# Patient Record
Sex: Male | Born: 1945 | Race: White | Hispanic: No
Health system: Southern US, Community
[De-identification: ages and names within clinical notes are randomized; demographics above are authoritative.]

## PROBLEM LIST (undated history)

## (undated) DIAGNOSIS — I4891 Unspecified atrial fibrillation: Secondary | ICD-10-CM

## (undated) DIAGNOSIS — I10 Essential (primary) hypertension: Secondary | ICD-10-CM

## (undated) DIAGNOSIS — E119 Type 2 diabetes mellitus without complications: Secondary | ICD-10-CM

## (undated) DIAGNOSIS — I639 Cerebral infarction, unspecified: Secondary | ICD-10-CM

---

## 2020-09-17 ENCOUNTER — Encounter (HOSPITAL_COMMUNITY): Payer: Self-pay

## 2020-09-17 ENCOUNTER — Observation Stay (HOSPITAL_COMMUNITY): Payer: Medicare Other

## 2020-09-17 ENCOUNTER — Emergency Department (HOSPITAL_COMMUNITY): Payer: Medicare Other

## 2020-09-17 ENCOUNTER — Observation Stay (HOSPITAL_BASED_OUTPATIENT_CLINIC_OR_DEPARTMENT_OTHER): Payer: Medicare Other

## 2020-09-17 ENCOUNTER — Other Ambulatory Visit: Payer: Self-pay

## 2020-09-17 ENCOUNTER — Inpatient Hospital Stay (HOSPITAL_COMMUNITY)
Admission: EM | Admit: 2020-09-17 | Discharge: 2020-09-19 | DRG: 065 | Disposition: A | Discharge status: Nursing Home (Includes ICF) | Payer: Medicare Other | Source: Skilled Nursing Facility | Attending: Internal Medicine | Admitting: Internal Medicine

## 2020-09-17 DIAGNOSIS — I63542 Cerebral infarction due to unspecified occlusion or stenosis of left cerebellar artery: Principal | ICD-10-CM | POA: Diagnosis present

## 2020-09-17 DIAGNOSIS — E785 Hyperlipidemia, unspecified: Secondary | ICD-10-CM | POA: Diagnosis present

## 2020-09-17 DIAGNOSIS — S0101XA Laceration without foreign body of scalp, initial encounter: Secondary | ICD-10-CM | POA: Diagnosis present

## 2020-09-17 DIAGNOSIS — F0390 Unspecified dementia without behavioral disturbance: Secondary | ICD-10-CM | POA: Diagnosis present

## 2020-09-17 DIAGNOSIS — I672 Cerebral atherosclerosis: Secondary | ICD-10-CM | POA: Diagnosis present

## 2020-09-17 DIAGNOSIS — R29701 NIHSS score 1: Secondary | ICD-10-CM | POA: Diagnosis present

## 2020-09-17 DIAGNOSIS — Z23 Encounter for immunization: Secondary | ICD-10-CM

## 2020-09-17 DIAGNOSIS — R29810 Facial weakness: Secondary | ICD-10-CM | POA: Diagnosis present

## 2020-09-17 DIAGNOSIS — I4510 Unspecified right bundle-branch block: Secondary | ICD-10-CM | POA: Diagnosis present

## 2020-09-17 DIAGNOSIS — E119 Type 2 diabetes mellitus without complications: Secondary | ICD-10-CM | POA: Diagnosis present

## 2020-09-17 DIAGNOSIS — Z8673 Personal history of transient ischemic attack (TIA), and cerebral infarction without residual deficits: Secondary | ICD-10-CM

## 2020-09-17 DIAGNOSIS — Z66 Do not resuscitate: Secondary | ICD-10-CM | POA: Diagnosis present

## 2020-09-17 DIAGNOSIS — I10 Essential (primary) hypertension: Secondary | ICD-10-CM | POA: Diagnosis present

## 2020-09-17 DIAGNOSIS — Z794 Long term (current) use of insulin: Secondary | ICD-10-CM

## 2020-09-17 DIAGNOSIS — Z20822 Contact with and (suspected) exposure to covid-19: Secondary | ICD-10-CM | POA: Diagnosis present

## 2020-09-17 DIAGNOSIS — D649 Anemia, unspecified: Secondary | ICD-10-CM | POA: Diagnosis present

## 2020-09-17 DIAGNOSIS — W06XXXA Fall from bed, initial encounter: Secondary | ICD-10-CM | POA: Diagnosis present

## 2020-09-17 DIAGNOSIS — I639 Cerebral infarction, unspecified: Secondary | ICD-10-CM

## 2020-09-17 DIAGNOSIS — W19XXXA Unspecified fall, initial encounter: Secondary | ICD-10-CM

## 2020-09-17 DIAGNOSIS — I6389 Other cerebral infarction: Secondary | ICD-10-CM

## 2020-09-17 DIAGNOSIS — N179 Acute kidney failure, unspecified: Secondary | ICD-10-CM | POA: Diagnosis present

## 2020-09-17 LAB — ECHOCARDIOGRAM COMPLETE
AR max vel: 2.87 cm2
AV Area VTI: 3.46 cm2
AV Area mean vel: 3.12 cm2
AV Mean grad: 3 mmHg
AV Peak grad: 7.1 mmHg
Ao pk vel: 1.33 m/s
Area-P 1/2: 2.76 cm2
Calc EF: 63.2 %
Height: 70 in
S' Lateral: 3.55 cm
Single Plane A2C EF: 53.4 %
Single Plane A4C EF: 71.6 %
Weight: 3440 oz

## 2020-09-17 LAB — URINALYSIS, ROUTINE W REFLEX MICROSCOPIC
Bilirubin Urine: NEGATIVE
Glucose, UA: NEGATIVE mg/dL
Ketones, ur: 20 mg/dL — AB
Nitrite: POSITIVE — AB
Protein, ur: 100 mg/dL — AB
Specific Gravity, Urine: 1.031 — ABNORMAL HIGH (ref 1.005–1.030)
pH: 5 (ref 5.0–8.0)

## 2020-09-17 LAB — RETICULOCYTES
Immature Retic Fract: 7.4 % (ref 2.3–15.9)
RBC.: 4.15 MIL/uL — ABNORMAL LOW (ref 4.22–5.81)
Retic Count, Absolute: 63.5 10*3/uL (ref 19.0–186.0)
Retic Ct Pct: 1.5 % (ref 0.4–3.1)

## 2020-09-17 LAB — LIPID PANEL
Cholesterol: 104 mg/dL (ref 0–200)
HDL: 28 mg/dL — ABNORMAL LOW (ref >40)
LDL Cholesterol: 59 mg/dL (ref 0–99)
Total CHOL/HDL Ratio: 3.7 RATIO
Triglycerides: 85 mg/dL (ref <150)
VLDL: 17 mg/dL (ref 0–40)

## 2020-09-17 LAB — IRON AND TIBC
Iron: 37 ug/dL — ABNORMAL LOW (ref 45–182)
Saturation Ratios: 20 % (ref 17.9–39.5)
TIBC: 185 ug/dL — ABNORMAL LOW (ref 250–450)
UIBC: 148 ug/dL

## 2020-09-17 LAB — COMPREHENSIVE METABOLIC PANEL
ALT: 15 U/L (ref 0–44)
AST: 27 U/L (ref 15–41)
Albumin: 2.8 g/dL — ABNORMAL LOW (ref 3.5–5.0)
Alkaline Phosphatase: 83 U/L (ref 38–126)
Anion gap: 12 (ref 5–15)
BUN: 13 mg/dL (ref 8–23)
CO2: 23 mmol/L (ref 22–32)
Calcium: 8.7 mg/dL — ABNORMAL LOW (ref 8.9–10.3)
Chloride: 103 mmol/L (ref 98–111)
Creatinine, Ser: 1.3 mg/dL — ABNORMAL HIGH (ref 0.61–1.24)
GFR, Estimated: 57 mL/min — ABNORMAL LOW (ref >60)
Glucose, Bld: 90 mg/dL (ref 70–99)
Potassium: 4.5 mmol/L (ref 3.5–5.1)
Sodium: 138 mmol/L (ref 135–145)
Total Bilirubin: 1.8 mg/dL — ABNORMAL HIGH (ref 0.3–1.2)
Total Protein: 5.9 g/dL — ABNORMAL LOW (ref 6.5–8.1)

## 2020-09-17 LAB — CBC WITH DIFFERENTIAL/PLATELET
Abs Immature Granulocytes: 0.02 10*3/uL (ref 0.00–0.07)
Basophils Absolute: 0 10*3/uL (ref 0.0–0.1)
Basophils Relative: 0 %
Eosinophils Absolute: 0 10*3/uL (ref 0.0–0.5)
Eosinophils Relative: 0 %
HCT: 40 % (ref 39.0–52.0)
Hemoglobin: 12.6 g/dL — ABNORMAL LOW (ref 13.0–17.0)
Immature Granulocytes: 0 %
Lymphocytes Relative: 10 %
Lymphs Abs: 0.7 10*3/uL (ref 0.7–4.0)
MCH: 28.3 pg (ref 26.0–34.0)
MCHC: 31.5 g/dL (ref 30.0–36.0)
MCV: 89.9 fL (ref 80.0–100.0)
Monocytes Absolute: 0.9 10*3/uL (ref 0.1–1.0)
Monocytes Relative: 13 %
Neutro Abs: 5.3 10*3/uL (ref 1.7–7.7)
Neutrophils Relative %: 77 %
Platelets: 270 10*3/uL (ref 150–400)
RBC: 4.45 MIL/uL (ref 4.22–5.81)
RDW: 13.8 % (ref 11.5–15.5)
WBC: 6.9 10*3/uL (ref 4.0–10.5)
nRBC: 0 % (ref 0.0–0.2)

## 2020-09-17 LAB — HEMOGLOBIN A1C
Hgb A1c MFr Bld: 8 % — ABNORMAL HIGH (ref 4.8–5.6)
Mean Plasma Glucose: 182.9 mg/dL

## 2020-09-17 LAB — CBC
HCT: 36.7 % — ABNORMAL LOW (ref 39.0–52.0)
Hemoglobin: 11.9 g/dL — ABNORMAL LOW (ref 13.0–17.0)
MCH: 28.7 pg (ref 26.0–34.0)
MCHC: 32.4 g/dL (ref 30.0–36.0)
MCV: 88.4 fL (ref 80.0–100.0)
Platelets: 238 10*3/uL (ref 150–400)
RBC: 4.15 MIL/uL — ABNORMAL LOW (ref 4.22–5.81)
RDW: 13.6 % (ref 11.5–15.5)
WBC: 6.6 10*3/uL (ref 4.0–10.5)
nRBC: 0 % (ref 0.0–0.2)

## 2020-09-17 LAB — RESP PANEL BY RT-PCR (FLU A&B, COVID) ARPGX2
Influenza A by PCR: NEGATIVE
Influenza B by PCR: NEGATIVE
SARS Coronavirus 2 by RT PCR: NEGATIVE

## 2020-09-17 LAB — FOLATE: Folate: 10 ng/mL (ref >5.9)

## 2020-09-17 LAB — CREATININE, SERUM
Creatinine, Ser: 1.27 mg/dL — ABNORMAL HIGH (ref 0.61–1.24)
GFR, Estimated: 58 mL/min — ABNORMAL LOW (ref >60)

## 2020-09-17 LAB — VITAMIN B12: Vitamin B-12: 212 pg/mL (ref 180–914)

## 2020-09-17 LAB — FERRITIN: Ferritin: 99 ng/mL (ref 24–336)

## 2020-09-17 MED ORDER — IOHEXOL 350 MG/ML SOLN
75.0000 mL | Freq: Once | INTRAVENOUS | Status: AC | PRN
  Administered 2020-09-17: 75 mL via INTRAVENOUS

## 2020-09-17 MED ORDER — ASPIRIN 300 MG RE SUPP: 300.0000 mg | Freq: Every day | RECTAL | Status: DC

## 2020-09-17 MED ORDER — HYDRALAZINE HCL 20 MG/ML IJ SOLN: 10.0000 mg | INTRAMUSCULAR | Status: DC | PRN

## 2020-09-17 MED ORDER — SODIUM CHLORIDE 0.9 % IV SOLN: INTRAVENOUS | Status: AC

## 2020-09-17 MED ORDER — LORAZEPAM 2 MG/ML IJ SOLN
1.0000 mg | Freq: Once | INTRAMUSCULAR | Status: AC
  Administered 2020-09-17: 1 mg via INTRAVENOUS
  Filled 2020-09-17: qty 1

## 2020-09-17 MED ORDER — ENOXAPARIN SODIUM 40 MG/0.4ML IJ SOSY
40.0000 mg | PREFILLED_SYRINGE | Freq: Every day | INTRAMUSCULAR | Status: DC
  Administered 2020-09-17 – 2020-09-18 (×2): 40 mg via SUBCUTANEOUS
  Filled 2020-09-17 (×2): qty 0.4

## 2020-09-17 MED ORDER — STROKE: EARLY STAGES OF RECOVERY BOOK
Freq: Once | Status: AC
  Filled 2020-09-17: qty 1

## 2020-09-17 MED ORDER — ASPIRIN 325 MG PO TABS
325.0000 mg | ORAL_TABLET | Freq: Every day | ORAL | Status: DC
  Administered 2020-09-18: 325 mg via ORAL
  Filled 2020-09-17 (×2): qty 1

## 2020-09-17 MED ORDER — TETANUS-DIPHTH-ACELL PERTUSSIS 5-2.5-18.5 LF-MCG/0.5 IM SUSY: 0.5000 mL | PREFILLED_SYRINGE | Freq: Once | INTRAMUSCULAR | Status: DC

## 2020-09-17 MED ORDER — ACETAMINOPHEN 325 MG PO TABS: 650.0000 mg | ORAL_TABLET | ORAL | Status: DC | PRN

## 2020-09-17 MED ORDER — ACETAMINOPHEN 650 MG RE SUPP: 650.0000 mg | RECTAL | Status: DC | PRN

## 2020-09-17 MED ORDER — MELATONIN 3 MG PO TABS
3.0000 mg | ORAL_TABLET | Freq: Every evening | ORAL | Status: DC | PRN
  Administered 2020-09-17: 3 mg via ORAL
  Filled 2020-09-17: qty 1

## 2020-09-17 MED ORDER — ACETAMINOPHEN 160 MG/5ML PO SOLN: 650.0000 mg | ORAL | Status: DC | PRN

## 2020-09-17 NOTE — ED Notes (Signed)
Attempted IV stick x 2  

## 2020-09-17 NOTE — TOC Initial Note (Signed)
Transition of Care [facility]) - Initial/Assessment Note    Patient Details  Name: Randall [name] MRN: [id number] Date of Birth: 1945-06-17  [facility] of [facility]) CM/SW Contact:    Baldemar Lenis, LCSW Phone Number: 09/17/2020, 4:26 PM  Clinical Narrative:          CSW contacted [facility] to discuss patient and evaluate if he can return with increased assist. CSW spoke with Lurena Joiner, who indicated that patient had only moved in about two hours prior to coming to the ED. He was admitted from [facility], where he was discharged 8/29. Lurena Joiner provided CSW with patient's Medicare information as well as contact information for patient's daughter, Jt Brabec [phone]). Per Lurena Joiner, she was unsure if the patient could return as he had just moved in, recommended speaking to the RN. CSW received a call from [place], [place] to discuss patient's current care needs. CSW read off PT and OT notes, and [facility] can increase care to accommodate +2 for the patient, but would need to discuss with the daughter. CSW attempted to reach patient's daughter Mardella Layman to discuss, left a voicemail. CSW updated MD and RN about records received from [facility], including home meds that can be put into the patient's chart. CSW to follow.         Expected Discharge Plan: Assisted Living Barriers to Discharge: Continued Medical Work up, English as a second language teacher   Patient Goals and CMS Choice Patient states their goals for this hospitalization and ongoing recovery are:: patient unable to participate in goal setting, only oriented to self CMS Medicare.gov Compare Post Acute Care list provided to:: Patient Represenative (must comment) Choice offered to / list presented to : Adult Children  Expected Discharge Plan and Services Expected Discharge Plan: Assisted Living       Living arrangements for the past 2 months: Post-Acute Facility                                      Prior  Living Arrangements/Services Living arrangements for the past 2 months: Post-Acute Facility Lives with:: Facility Resident Patient language and need for interpreter reviewed:: No Do you feel safe going back to the place where you live?: Yes      Need for Family Participation in Patient Care: Yes (Comment) Care giver support system in place?: Yes (comment)   Criminal Activity/Legal Involvement Pertinent to Current Situation/Hospitalization: No - Comment as needed  Activities of Daily Living      Permission Sought/Granted Permission sought to share information with : Family Supports, Oceanographer granted to share information with : Yes, Verbal Permission Granted  Share Information with NAME: Mardella Layman  Permission granted to share info w AGENCY: [employer]  Permission granted to share info w Relationship: Daughter     Emotional Assessment   Attitude/Demeanor/Rapport: Unable to Assess Affect (typically observed): Unable to Assess Orientation: : Oriented to Self Alcohol / Substance Use: Not Applicable Psych Involvement: No (comment)  Admission diagnosis:  Fall, initial encounter [W19.XXXA] Acute CVA (cerebrovascular accident) [facility]) [I63.9] Cerebrovascular accident (CVA), unspecified mechanism (HCC) [I63.9] Patient Active Problem List   Diagnosis Date Noted   Acute CVA (cerebrovascular accident) (HCC) 09/17/2020   ARF (acute renal failure) (HCC) 09/17/2020   Normocytic anemia 09/17/2020   PCP:  Pcp, No Pharmacy:  No Pharmacies Listed    Social Determinants of Health (SDOH) Interventions    Readmission Risk Interventions No  flowsheet data found.

## 2020-09-17 NOTE — ED Notes (Signed)
Pt refused MRI.

## 2020-09-17 NOTE — ED Notes (Signed)
Pt to CT

## 2020-09-17 NOTE — Progress Notes (Signed)
Pt's son at the bedside. Updated. MD notified and spoke to him. Pharm Tech called/papers were given which were sent from prior facility. Son is asking about DNR, will bring POA papers

## 2020-09-17 NOTE — Progress Notes (Addendum)
PROGRESS NOTE  Randall [name] NWG:[id number] DOB: 1945-03-23 DOA: 09/17/2020 PCP: Pcp, No   LOS: 0 days   Brief narrative:  Randall [name] is a 75 y.o. male presented to the hospital with  falling and hitting his face on the floor.  Patient was a poor historian did not  remember the event but did not lose consciousness.  In the ER, patient has a laceration on his face from the fall.  CT head and C-spine done showed subacute stroke on the left cerebellar area and old stroke in the left occipital area.  EKG showed normal sinus rhythm with RBBB.  Labs show elevated creatinine of 1.3 albumin 2.8 total bilirubin 1.8.  COVID test was negative.  No old labs to compare.  Patient was seen by neurology and was admitted for further work-up of stroke.  Assessment/Plan:  Principal Problem:   Acute CVA (cerebrovascular accident) (HCC) Active Problems:   ARF (acute renal failure) (HCC)   Normocytic anemia  Acute stroke.   Neurology on board.  Spoke with Dr. Pearlean Brownie.  CT head scan showed age-indeterminate left cerebellar infarct.  CT cervical spine showed  indeterminate left cerebellar infarct.  Check CTA of the head and neck.  MRI of the brain has been ordered.  Hemoglobin A1c of 8.0.  Status post fall with laceration in the face. Continue supportive care.  Check PT  Acute renal failure  No old labs to compare.  Creatinine of 1.3.  Anemia normocytic normochromic  Ferritin 99, iron at 37 and TIBC low at 185.  Vitamin B12 and folic acid acid within normal limits.  Follow hemoglobin.  Elevated total bilirubin levels.   AST ALT and alkaline phosphorus normal.  Follow LFTs.  DVT prophylaxis: enoxaparin (LOVENOX) injection 40 mg Start: 09/17/20 1000  Code Status: Full code  Family Communication:  I was finally able to talk to the family and updated the son on the phone. Answered all queries. The son is the HPOA.  Status is: Observation  The patient will require care spanning > 2 midnights and should be  moved to inpatient because: IV treatments appropriate due to intensity of illness or inability to take PO and Inpatient level of care appropriate due to severity of illness  Dispo: The patient is from: Home              Anticipated d/c is to: Home              Patient currently is not medically stable to d/c.   Difficult to place patient No  Consultants: Neurology  Procedures: None  Anti-infectives:  None  Anti-infectives (From admission, onward)    None      Subjective:  Today, patient was seen and examined at bedside.  Has a mild discomfort in the head.  Denies any headache, nausea vomiting.  Objective: Vitals:   09/17/20 0815 09/17/20 1047  BP:  (!) 174/80  Pulse:  70  Resp:  18  Temp: 98.6 F (37 C) 98.2 F (36.8 C)  SpO2:  98%    Intake/Output Summary (Last 24 hours) at 09/17/2020 1145 Last data filed at 09/17/2020 0900 Gross per 24 hour  Intake 165 ml  Output 0 ml  Net 165 ml   Filed Weights   09/17/20 0051  Weight: 97.5 kg   Body mass index is 30.85 kg/m.   Physical Exam:  GENERAL: Patient is alert awake and oriented. Not in obvious distress.  Obese. HENT: No scleral pallor or icterus. Pupils equally reactive  to light. Oral mucosa is moist.  Small laceration on the facial area  NECK: is supple, no gross swelling noted. CHEST: Clear to auscultation. No crackles or wheezes.  Diminished breath sounds bilaterally. CVS: S1 and S2 heard, no murmur. Regular rate and rhythm.  ABDOMEN: Soft, non-tender, bowel sounds are present. EXTREMITIES: No edema. CNS: Cranial nerves are intact. No focal motor deficits.  Oriented to name and place. SKIN: warm and dry without rashes.  Data Review: I have personally reviewed the following laboratory data and studies,  CBC: Recent Labs  Lab 09/17/20 0300 09/17/20 0645  WBC 6.9 6.6  NEUTROABS 5.3  --   HGB 12.6* 11.9*  HCT 40.0 36.7*  MCV 89.9 88.4  PLT 270 238   Basic Metabolic Panel: Recent Labs  Lab  09/17/20 0300 09/17/20 0645  NA 138  --   K 4.5  --   CL 103  --   CO2 23  --   GLUCOSE 90  --   BUN 13  --   CREATININE 1.30* 1.27*  CALCIUM 8.7*  --    Liver Function Tests: Recent Labs  Lab 09/17/20 0300  AST 27  ALT 15  ALKPHOS 83  BILITOT 1.8*  PROT 5.9*  ALBUMIN 2.8*   No results for input(s): LIPASE, AMYLASE in the last 168 hours. No results for input(s): AMMONIA in the last 168 hours. Cardiac Enzymes: No results for input(s): CKTOTAL, CKMB, CKMBINDEX, TROPONINI in the last 168 hours. BNP (last 3 results) No results for input(s): BNP in the last 8760 hours.  ProBNP (last 3 results) No results for input(s): PROBNP in the last 8760 hours.  CBG: No results for input(s): GLUCAP in the last 168 hours. Recent Results (from the past 240 hour(s))  Resp Panel by RT-PCR (Flu A&B, Covid) Nasopharyngeal Swab     Status: None   Collection Time: 09/17/20  3:00 AM   Specimen: Nasopharyngeal Swab; Nasopharyngeal(NP) swabs in vial transport medium  Result Value Ref Range Status   SARS Coronavirus 2 by RT PCR NEGATIVE NEGATIVE Final    Comment: (NOTE) SARS-CoV-2 target nucleic acids are NOT DETECTED.  The SARS-CoV-2 RNA is generally detectable in upper respiratory specimens during the acute phase of infection. The lowest concentration of SARS-CoV-2 viral copies this assay can detect is 138 copies/mL. A negative result does not preclude SARS-Cov-2 infection and should not be used as the sole basis for treatment or other patient management decisions. A negative result may occur with  improper specimen collection/handling, submission of specimen other than nasopharyngeal swab, presence of viral mutation(s) within the areas targeted by this assay, and inadequate number of viral copies(<138 copies/mL). A negative result must be combined with clinical observations, patient history, and epidemiological information. The expected result is Negative.  Fact Sheet for Patients:   BloggerCourse.com  Fact Sheet for Healthcare Providers:  SeriousBroker.it  This test is no t yet approved or cleared by the [place] FDA and  has been authorized for detection and/or diagnosis of SARS-CoV-2 by FDA under an Emergency Use Authorization (EUA). This EUA will remain  in effect (meaning this test can be used) for the duration of the COVID-19 declaration under Section 564(b)(1) of the Act, 21 U.S.C.section 360bbb-3(b)(1), unless the authorization is terminated  or revoked sooner.       Influenza A by PCR NEGATIVE NEGATIVE Final   Influenza B by PCR NEGATIVE NEGATIVE Final    Comment: (NOTE) The Xpert Xpress SARS-CoV-2/FLU/RSV plus assay is intended as an aid  in the diagnosis of influenza from Nasopharyngeal swab specimens and should not be used as a sole basis for treatment. Nasal washings and aspirates are unacceptable for Xpert Xpress SARS-CoV-2/FLU/RSV testing.  Fact Sheet for Patients: BloggerCourse.comhttps://www.fda.gov/media/152166/download  Fact Sheet for Healthcare Providers: SeriousBroker.ithttps://www.fda.gov/media/152162/download  This test is not yet approved or cleared by the [place] FDA and has been authorized for detection and/or diagnosis of SARS-CoV-2 by FDA under an Emergency Use Authorization (EUA). This EUA will remain in effect (meaning this test can be used) for the duration of the COVID-19 declaration under Section 564(b)(1) of the Act, 21 U.S.C. section 360bbb-3(b)(1), unless the authorization is terminated or revoked.  Performed at [facility], [address] [place], [place] [address]      Studies: CT HEAD WO CONTRAST (5MM)  Result Date: 09/17/2020 CLINICAL DATA:  Trauma. EXAM: CT HEAD WITHOUT CONTRAST CT CERVICAL SPINE WITHOUT CONTRAST TECHNIQUE: Multidetector CT imaging of the head and cervical spine was performed following the standard protocol without intravenous contrast. Multiplanar CT image  reconstructions of the cervical spine were also generated. COMPARISON:  None. FINDINGS: CT HEAD FINDINGS Brain: Moderate age-related atrophy and chronic microvascular ischemic changes. 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 An area of infarct involving the left cerebellum, age indeterminate, possibly subacute. Acute infarct is not excluded clinical correlation is recommended. MRI may provide better evaluation if there is high clinical concern for acute infarct. There is no acute intracranial hemorrhage. No mass effect or midline shift. No extra-axial fluid collection. Vascular: No hyperdense vessel or unexpected calcification. Skull: Normal. Negative for fracture or focal lesion. Sinuses/Orbits: No acute finding. Other: None CT CERVICAL SPINE FINDINGS Alignment: No acute subluxation. Skull base and vertebrae: Acute fracture.  Osteopenia. Soft tissues and spinal canal: No prevertebral fluid or swelling. No visible canal hematoma. Disc levels:  Degenerative changes primarily at C5-C6. Upper chest: Negative. Other: Bilateral carotid bulb calcified plaques. IMPRESSION: 1. No acute intracranial hemorrhage. 2. Age indeterminate left cerebellar infarct, possibly subacute. Clinical correlation is recommended. Old left occipital infarct. 3. No acute/traumatic cervical spine pathology. These results were called by telephone at the time of interpretation on 09/17/2020 at 1:58 am to provider [facility] , who verbally acknowledged these results. Electronically Signed   By: Elgie CollardArash  Radparvar M.D.   On: 09/17/2020 02:09   CT Cervical Spine Wo Contrast  Result Date: 09/17/2020 CLINICAL DATA:  Trauma. EXAM: CT HEAD WITHOUT CONTRAST CT CERVICAL SPINE WITHOUT CONTRAST TECHNIQUE: Multidetector CT imaging of the head and cervical spine was performed following the standard protocol without intravenous contrast. Multiplanar CT image reconstructions of the cervical spine were also generated. COMPARISON:  None. FINDINGS: CT  HEAD FINDINGS Brain: Moderate age-related atrophy and chronic microvascular ischemic changes. 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 An area of infarct involving the left cerebellum, age indeterminate, possibly subacute. Acute infarct is not excluded clinical correlation is recommended. MRI may provide better evaluation if there is high clinical concern for acute infarct. There is no acute intracranial hemorrhage. No mass effect or midline shift. No extra-axial fluid collection. Vascular: No hyperdense vessel or unexpected calcification. Skull: Normal. Negative for fracture or focal lesion. Sinuses/Orbits: No acute finding. Other: None CT CERVICAL SPINE FINDINGS Alignment: No acute subluxation. Skull base and vertebrae: Acute fracture.  Osteopenia. Soft tissues and spinal canal: No prevertebral fluid or swelling. No visible canal hematoma. Disc levels:  Degenerative changes primarily at C5-C6. Upper chest: Negative. Other: Bilateral carotid bulb calcified plaques. IMPRESSION: 1. No acute intracranial hemorrhage. 2. Age  indeterminate left cerebellar infarct, possibly subacute. Clinical correlation is recommended. Old left occipital infarct. 3. No acute/traumatic cervical spine pathology. These results were called by telephone at the time of interpretation on 09/17/2020 at 1:58 am to provider [facility] , who verbally acknowledged these results. Electronically Signed   By: Elgie Collard M.D.   On: 09/17/2020 02:09   DG Pelvis Portable  Result Date: 09/17/2020 CLINICAL DATA:  Trauma. EXAM: PORTABLE PELVIS 1-2 VIEWS COMPARISON:  None. FINDINGS: There is no acute fracture or dislocation the bones are osteopenic. Degenerative changes of the lower lumbar spine. The soft tissues are unremarkable. IMPRESSION: No acute findings. Electronically Signed   By: Elgie Collard M.D.   On: 09/17/2020 01:21   DG Chest Portable 1 View  Result Date: 09/17/2020 CLINICAL DATA:  Trauma. EXAM: PORTABLE CHEST 1 VIEW  COMPARISON:  None. FINDINGS: Mild cardiomegaly. No focal consolidation or pneumothorax. Trace right pleural effusion present. Atherosclerotic calcification of the aorta. No acute osseous pathology. IMPRESSION: 1. Mild cardiomegaly. 2. Possible trace right pleural effusion.  No consolidation. Electronically Signed   By: Elgie Collard M.D.   On: 09/17/2020 01:20      Joycelyn Das, MD  [facility] 09/17/2020  If 7PM-7AM, please contact night-coverage

## 2020-09-17 NOTE — Evaluation (Signed)
Occupational Therapy Evaluation Patient Details Name: Randall [name] MRN: [id number] DOB: 17-Jun-1945 Today's Date: 09/17/2020    History of Present Illness Pt is a 75yo male from [facility] fell out of bed and hit head on recliner. 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 Labs reveal acute renal failure and anemia. PMH: unknown at this time.   Clinical Impression   PT admitted with s/p fall with acute CVA. Pt currently with functional limitiations due to the deficits listed below (see OT problem list). Pt currently with balance deficits and requires +2 max (A) with transfer using RW. Pt (A) for static standing. Pt with cognitive deficits and needs redirection to task.  Pt will benefit from skilled OT to increase their independence and safety with adls and balance to allow discharge SNF ( return to current facility if can manage at this level).     Follow Up Recommendations  SNF    Equipment Recommendations  None recommended by OT    Recommendations for Other Services       Precautions / Restrictions Precautions Precautions: Fall Restrictions Weight Bearing Restrictions: No      Mobility Bed Mobility Overal bed mobility: Needs Assistance Bed Mobility: Supine to Sit;Sit to Supine     Supine to sit: Mod assist;+2 for physical assistance;+2 for safety/equipment;HOB elevated Sit to supine: Mod assist;+2 for physical assistance   General bed mobility comments: Pt required mod assist +2 for physical assist for sit to supine, including LE advancement off bed and trunk control to acheive upright sitting posture. Min assist to stay sitting upright. Max multimodal cuing and encouragement to complete task. pt requesting the bed to (A)    Transfers Overall transfer level: Needs assistance Equipment used: Rolling walker (2 wheeled) Transfers: Sit to/from Stand Sit to Stand: Min assist;+2  safety/equipment         General transfer comment: Pt required min assist +2 for lift assist and steadying as well as managing lines. For stand to sit transfer pt min assist for safe lowering to bed as pt was expressing fatigue and the desire to lay down. Pt was able to stand for ~15s to allow for removal of briefs prior to sitting.    Balance Overall balance assessment: Needs assistance Sitting-balance support: Single extremity supported;Feet supported Sitting balance-Leahy Scale: Poor Sitting balance - Comments: Pt able to sit with single extremity support and min assist from the posterior.   Standing balance support: Bilateral upper extremity supported;During functional activity Standing balance-Leahy Scale: Poor Standing balance comment: Pt reliant on BUE on RW during ambulation in room. Able to stand for a brief period to wash face but demonstrated increasing anterior lean and required cuing and physical assist to correct.                           ADL either performed or assessed with clinical judgement   ADL Overall ADL's : Needs assistance/impaired     Grooming: Wash/dry hands;Wash/dry face;Standing;Minimal assistance (total +2 Mod (A) to static stand)               Lower Body Dressing: Total assistance;Bed level         Toileting - Clothing Manipulation Details (indicate cue type and reason): declines to void and noted to have saturated brief without awareness. pt with brief sliding off due to soiled heavily. RN made aware pt without  brief and incontinence     Functional mobility during ADLs: +2 for physical assistance;Maximal assistance;Rolling walker General ADL Comments: pt reports at baseline getting help for adls. pt total (A) for socks this session. pt asking staff to have bed complete bed transfer. pt expressed "they do it" when asked about bathing/ dressing     Vision   Additional Comments: pt reports baseline R hemianopsia from previou CVA. pt  keeping eyes closed majority of session. not agreable to formal testing. light sensitive and noted to have swelling above L eye     Perception     Praxis      Pertinent Vitals/Pain Pain Assessment: No/denies pain     Hand Dominance Right   Extremity/Trunk Assessment Upper Extremity Assessment Upper Extremity Assessment: Generalized weakness;LUE deficits/detail LUE Deficits / Details: noted to be weaker than R but diffculty with formal testing due to patient expressing fatigue and engaging   Lower Extremity Assessment Lower Extremity Assessment: Generalized weakness;LLE deficits/detail;RLE deficits/detail RLE Sensation: WNL LLE Deficits / Details: Pt reports his left leg is weaker than right. LLE Sensation: WNL   Cervical / Trunk Assessment Cervical / Trunk Assessment: Normal   Communication Communication Communication: No difficulties   Cognition Arousal/Alertness: Awake/alert;Lethargic Behavior During Therapy: Flat affect Overall Cognitive Status: No family/caregiver present to determine baseline cognitive functioning                                 General Comments: Pt has history of CVA but denies any changes in cognition. Pt generally tired and expressed desire to sleep. pt asking the same questions several time during session with no recall within seconds of information provided. recommend 1 step commands. pt declined oob but agreeable to wash face so cued we are sitting up to wash your face. pt state okay at this point   General Comments  pt noted to have wound on R side of face. Pt aware of injury to mouth when asked questions    Exercises     Shoulder Instructions      Home Living Family/patient expects to be discharged to:: Assisted living                             Home Equipment: Walker - 2 wheels          Prior Functioning/Environment Level of Independence: Needs assistance  Gait / Transfers Assistance Needed: Uses RW for  short distance ambulation at assisted living ADL's / Homemaking Assistance Needed: Needs assist for all ADLs, mentioned bathing, dressing, cooking. Communication / Swallowing Assistance Needed: Delayed answers to home environment questions          OT Problem List: Decreased strength;Decreased activity tolerance;Impaired balance (sitting and/or standing);Decreased safety awareness;Decreased knowledge of use of DME or AE;Decreased knowledge of precautions;Obesity;Decreased cognition      OT Treatment/Interventions: Self-care/ADL training;Therapeutic exercise;Neuromuscular education;Energy conservation;DME and/or AE instruction;Manual therapy;Modalities;Therapeutic activities;Cognitive remediation/compensation;Visual/perceptual remediation/compensation;Patient/family education;Balance training    OT Goals(Current goals can be found in the care plan section) Acute Rehab OT Goals Patient Stated Goal: to sleep OT Goal Formulation: Patient unable to participate in goal setting Time For Goal Achievement: 10/01/20 Potential to Achieve Goals: Good  OT Frequency: Min 2X/week   Barriers to D/C:            Co-evaluation PT/OT/SLP Co-Evaluation/Treatment: Yes Reason for Co-Treatment: Complexity of the patient's impairments (multi-system involvement);Necessary to address cognition/behavior  during functional activity;For patient/therapist safety;To address functional/ADL transfers PT goals addressed during session: Mobility/safety with mobility OT goals addressed during session: ADL's and self-care;Proper use of Adaptive equipment and DME;Strengthening/ROM      AM-PAC OT "6 Clicks" Daily Activity     Outcome Measure Help from another person eating meals?: A Lot Help from another person taking care of personal grooming?: A Lot Help from another person toileting, which includes using toliet, bedpan, or urinal?: A Lot Help from another person bathing (including washing, rinsing, drying)?: A  Lot Help from another person to put on and taking off regular upper body clothing?: A Lot Help from another person to put on and taking off regular lower body clothing?: Total 6 Click Score: 11   End of Session Equipment Utilized During Treatment: Gait belt;Rolling walker Nurse Communication: Mobility status;Precautions  Activity Tolerance: Patient limited by fatigue Patient left: in bed;with call bell/phone within reach;with bed alarm set  OT Visit Diagnosis: Unsteadiness on feet (R26.81);Muscle weakness (generalized) (M62.81);Repeated falls (R29.6)                Time: 7017-7939 OT Time Calculation (min): 21 min Charges:  OT General Charges $OT Visit: 1 Visit OT Evaluation $OT Eval Moderate Complexity: 1 Mod   Brynn, OTR/L  Acute Rehabilitation Services Pager: [phone] Office: [phone] .   Mateo Flow 09/17/2020, 11:07 AM

## 2020-09-17 NOTE — Progress Notes (Addendum)
Physical Therapy Evaluation Patient Details Name: Randall [name] MRN: [id number] DOB: 1946-01-03 Today's Date: 09/17/2020   History of Present Illness  Pt is a 75yo male from [facility] fell out of bed and hit head on recliner. 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 Labs reveal acute renal failure and anemia. PMH: unknown at this time.  Clinical Impression  Pt presents with the impairments above and problems below. Required mod assist +2 for bed mobility, min assist +2 for transfers. Initially, pt requiring min assist +2 for ambulation with RW to sink. During ambulation back to bed, pt with increased unsteadiness and repeated LOBs to the R so level of assist increased to max assist +2. Treatment session limited by pt's fatigue. Recommending SNF-level therapies upon discharge unless ALF able to provide necessary assist. If ALF can provide necessary assist, will benefit from HHPT. We will continue to follow him acutely to promote independence with functional mobility.     Follow Up Recommendations SNF (unless ALF able to provide necessary assist.)    Equipment Recommendations  Wheelchair (measurements PT);Wheelchair cushion (measurements PT)    Recommendations for Other Services       Precautions / Restrictions Precautions Precautions: Fall Restrictions Weight Bearing Restrictions: No      Mobility  Bed Mobility Overal bed mobility: Needs Assistance Bed Mobility: Supine to Sit;Sit to Supine     Supine to sit: Mod assist;+2 for physical assistance;+2 for safety/equipment;HOB elevated Sit to supine: Mod assist;+2 for physical assistance   General bed mobility comments: Pt required mod assist +2 for physical assist for sit to supine, including LE advancement off bed and trunk control to acheive upright sitting posture. Min assist to stay sitting upright. Max multimodal cuing and encouragement  to complete task. pt requesting the bed to (A)    Transfers Overall transfer level: Needs assistance Equipment used: Rolling walker (2 wheeled) Transfers: Sit to/from Stand Sit to Stand: Min assist;+2 safety/equipment         General transfer comment: Pt required min assist +2 for lift assist and steadying as well as managing lines. For stand to sit transfer pt min assist for safe lowering to bed as pt was expressing fatigue and the desire to lay down. Pt was able to stand for ~15s to allow for removal of briefs prior to sitting.  Ambulation/Gait Ambulation/Gait assistance: Min assist;+2 safety/equipment;Max assist;+2 physical assistance Gait Distance (Feet): 8 Feet Assistive device: Rolling walker (2 wheeled) Gait Pattern/deviations: Step-to pattern;Decreased step length - right;Decreased step length - left;Decreased dorsiflexion - right;Decreased dorsiflexion - left;Shuffle;Staggering right Gait velocity: decreased   General Gait Details: Pt ambulated with RW from EOB to sink to perform cleanup of facial blood, min assist +2 for physical assist for steadying and safety (IV bag did not have pole). Pt able to stand at sink for ~90s and wipe off face. As length of standing progressed, pt began to demonstrate anterior lean and express fatigue. On return trip to EOB, pt required max assist +2 for steadying as pt with R lateral lean and multiple LOB. Pt also with difficulty using RW correctly and tended to carry it.  Stairs            Wheelchair Mobility    Modified Rankin (Stroke Patients Only) Modified Rankin (Stroke Patients Only) Pre-Morbid Rankin Score: Moderately severe disability Modified Rankin: Moderately severe disability     Balance Overall balance assessment: Needs assistance Sitting-balance  support: Single extremity supported;Feet supported Sitting balance-Leahy Scale: Poor Sitting balance - Comments: Pt able to sit with single extremity support and min assist from  the posterior.   Standing balance support: Bilateral upper extremity supported;During functional activity Standing balance-Leahy Scale: Poor Standing balance comment: Pt reliant on BUE on RW during ambulation in room. Able to stand for a brief period to wash face but demonstrated increasing anterior lean and required cuing and physical assist to correct.                             Pertinent Vitals/Pain Pain Assessment: No/denies pain    Home Living Family/patient expects to be discharged to:: Assisted living               Home Equipment: Walker - 2 wheels      Prior Function Level of Independence: Needs assistance   Gait / Transfers Assistance Needed: Uses RW for short distance ambulation at assisted living  ADL's / Homemaking Assistance Needed: Needs assist for all ADLs, mentioned bathing, dressing, cooking.        Hand Dominance   Dominant Hand: Right    Extremity/Trunk Assessment   Upper Extremity Assessment Upper Extremity Assessment: Generalized weakness;LUE deficits/detail LUE Deficits / Details: noted to be weaker than R but diffculty with formal testing due to patient expressing fatigue and engaging    Lower Extremity Assessment Lower Extremity Assessment: Generalized weakness;LLE deficits/detail;RLE deficits/detail RLE Sensation: WNL LLE Deficits / Details: Pt reports his left leg is weaker than right. LLE Sensation: WNL    Cervical / Trunk Assessment Cervical / Trunk Assessment: Normal  Communication   Communication: No difficulties  Cognition Arousal/Alertness: Awake/alert;Lethargic Behavior During Therapy: Flat affect Overall Cognitive Status: No family/caregiver present to determine baseline cognitive functioning                                 General Comments: Pt has history of CVA but denies any changes in cognition. Pt generally tired and expressed desire to sleep. pt asking the same questions several time during  session with no recall within seconds of information provided. recommend 1 step commands. pt declined oob but agreeable to wash face so cued we are sitting up to wash your face. pt state okay at this point      General Comments General comments (skin integrity, edema, etc.): pt noted to have wound on R side of face. Pt aware of injury to mouth when asked questions    Exercises     Assessment/Plan    PT Assessment Patient needs continued PT services  PT Problem List Decreased strength;Decreased range of motion;Decreased activity tolerance;Decreased balance;Decreased mobility;Decreased coordination;Decreased cognition;Decreased knowledge of use of DME;Decreased safety awareness       PT Treatment Interventions DME instruction;Gait training;Stair training;Functional mobility training;Therapeutic activities;Therapeutic exercise;Balance training;Neuromuscular re-education;Cognitive remediation;Wheelchair mobility training    PT Goals (Current goals can be found in the Care Plan section)  Acute Rehab PT Goals Patient Stated Goal: to sleep PT Goal Formulation: With patient Time For Goal Achievement: 10/01/20 Potential to Achieve Goals: Good    Frequency Min 3X/week   Barriers to discharge        Co-evaluation PT/OT/SLP Co-Evaluation/Treatment: Yes Reason for Co-Treatment: Complexity of the patient's impairments (multi-system involvement);Necessary to address cognition/behavior during functional activity;For patient/therapist safety;To address functional/ADL transfers PT goals addressed during session: Mobility/safety with mobility OT goals addressed  during session: ADL's and self-care;Proper use of Adaptive equipment and DME;Strengthening/ROM       AM-PAC PT "6 Clicks" Mobility  Outcome Measure Help needed turning from your back to your side while in a flat bed without using bedrails?: A Little Help needed moving from lying on your back to sitting on the side of a flat bed without  using bedrails?: Total Help needed moving to and from a bed to a chair (including a wheelchair)?: Total Help needed standing up from a chair using your arms (e.g., wheelchair or bedside chair)?: Total Help needed to walk in hospital room?: Total Help needed climbing 3-5 steps with a railing? : Total 6 Click Score: 8    End of Session Equipment Utilized During Treatment: Gait belt Activity Tolerance: Patient limited by fatigue Patient left: in bed;with call bell/phone within reach;with bed alarm set Nurse Communication: Mobility status PT Visit Diagnosis: Other symptoms and signs involving the nervous system (R29.898);Muscle weakness (generalized) (M62.81);Other abnormalities of gait and mobility (R26.89);History of falling (Z91.81)    Time: 1005-1026 PT Time Calculation (min) (ACUTE ONLY): 21 min   Charges:   PT Evaluation $PT Eval Moderate Complexity: 1 Mod          Johnn Hai, SPT Johnn Hai 09/17/2020, 1:24 PM

## 2020-09-17 NOTE — H&P (Signed)
History and Physical    Randall [name] TGP:[id number] DOB: [birth year] DOA: 09/17/2020  PCP: Pcp, No  Patient coming from: Assisted living facility.  Chief Complaint: Fall.  HPI: Randall [name] is a 75 y.o. male with unknown past medical history was brought to the ER after patient slid out of the bed and he fell onto the floor hitting his face.  Patient is a poor historian and does not remember or recall his past medical history.  Patient is stating that he fell from the bed but did not lose consciousness.  Denies any pain.  Is able to move all extremities.  ED Course: In the ER patient has a laceration on his face from the fall.  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  EKG shows normal sinus rhythm with RBBB.  Labs show elevated creatinine of 1.3 albumin 2.8 total bilirubin 1.8 globin 12.6 COVID test pending.  No old labs to compare.  Patient was seen by neurologist Dr. Gwyneth Sprout and patient has been admitted for further work-up of stroke.  Review of Systems: As per HPI, rest all negative.   History reviewed. No pertinent past medical history.  History reviewed. No pertinent surgical history.   reports that he has never smoked. He has never used smokeless tobacco. He reports that he does not drink alcohol. No history on file for drug use.  No Known Allergies  Family History  Family history unknown: Yes    Prior to Admission medications   Not on File    Physical Exam: Constitutional: Moderately built and nourished. Vitals:   09/17/20 0330 09/17/20 0345 09/17/20 0415 09/17/20 0430  BP: (!) 130/46 (!) 160/66 (!) 168/71 140/65  Pulse: 61 84 74 74  Resp: _0 Temp:      SpO2: 99% 99% 98% 96%  Weight:      Height:       Eyes: Anicteric no pallor. ENMT: No discharge from the ears eyes nose with a small laceration on the facial area with some bleeding. Neck: No mass felt.  No neck  rigidity. Respiratory: No rhonchi or crepitations. Cardiovascular: S1-S2 heard. Abdomen: Soft nontender bowel sounds present. Musculoskeletal: No edema. Skin: No rash. Neurologic: Alert awake oriented to person and place moving all extremities 5 x 5 no facial asymmetry tongue is midline pupils equal reacting to light. Psychiatric: Oriented to name and place.   Labs on Admission: I have personally reviewed following labs and imaging studies  CBC: Recent Labs  Lab 09/17/20 0300  WBC 6.9  NEUTROABS 5.3  HGB 12.6*  HCT 40.0  MCV 89.9  PLT 309   Basic Metabolic Panel: Recent Labs  Lab 09/17/20 0300  NA 138  K 4.5  CL 103  CO2 23  GLUCOSE 90  BUN 13  CREATININE 1.30*  CALCIUM 8.7*   GFR: Estimated Creatinine Clearance: 55.7 mL/min (A) (by C-G formula based on SCr of 1.3 mg/dL (H)). Liver Function Tests: Recent Labs  Lab 09/17/20 0300  AST 27  ALT 15  ALKPHOS 83  BILITOT 1.8*  PROT 5.9*  ALBUMIN 2.8*   No results for input(s): LIPASE, AMYLASE in the last 168 hours. No results for input(s): AMMONIA in the last 168 hours. Coagulation Profile: No results for input(s): INR, PROTIME in the last 168 hours. Cardiac Enzymes: No results for input(s): CKTOTAL, CKMB, CKMBINDEX, TROPONINI in the last 168 hours. BNP (last 3 results) No  results for input(s): PROBNP in the last 8760 hours. HbA1C: No results for input(s): HGBA1C in the last 72 hours. CBG: No results for input(s): GLUCAP in the last 168 hours. Lipid Profile: No results for input(s): CHOL, HDL, LDLCALC, TRIG, CHOLHDL, LDLDIRECT in the last 72 hours. Thyroid Function Tests: No results for input(s): TSH, T4TOTAL, FREET4, T3FREE, THYROIDAB in the last 72 hours. Anemia Panel: No results for input(s): VITAMINB12, FOLATE, FERRITIN, TIBC, IRON, RETICCTPCT in the last 72 hours. Urine analysis: No results found for: COLORURINE, APPEARANCEUR, LABSPEC, PHURINE, GLUCOSEU, HGBUR, BILIRUBINUR, KETONESUR, PROTEINUR,  UROBILINOGEN, NITRITE, LEUKOCYTESUR Sepsis Labs: _0 (procalcitonin:4,lacticidven:4) ) Recent Results (from the past 240 hour(s))  Resp Panel by RT-PCR (Flu A&B, Covid) Nasopharyngeal Swab     Status: None   Collection Time: 09/17/20  3:00 AM   Specimen: Nasopharyngeal Swab; Nasopharyngeal(NP) swabs in vial transport medium  Result Value Ref Range Status   SARS Coronavirus 2 by RT PCR NEGATIVE NEGATIVE Final    Comment: (NOTE) SARS-CoV-2 target nucleic acids are NOT DETECTED.  The SARS-CoV-2 RNA is generally detectable in upper respiratory specimens during the acute phase of infection. The lowest concentration of SARS-CoV-2 viral copies this assay can detect is 138 copies/mL. A negative result does not preclude SARS-Cov-2 infection and should not be used as the sole basis for treatment or other patient management decisions. A negative result may occur with  improper specimen collection/handling, submission of specimen other than nasopharyngeal swab, presence of viral mutation(s) within the areas targeted by this assay, and inadequate number of viral copies(<138 copies/mL). A negative result must be combined with clinical observations, patient history, and epidemiological information. The expected result is Negative.  Fact Sheet for Patients:  EntrepreneurPulse.com.au  Fact Sheet for Healthcare Providers:  IncredibleEmployment.be  This test is no t yet approved or cleared by the [place] FDA and  has been authorized for detection and/or diagnosis of SARS-CoV-2 by FDA under an Emergency Use Authorization (EUA). This EUA will remain  in effect (meaning this test can be used) for the duration of the COVID-19 declaration under Section 564(b)(1) of the Act, 21 U.S.C.section 360bbb-3(b)(1), unless the authorization is terminated  or revoked sooner.       Influenza A by PCR NEGATIVE NEGATIVE Final   Influenza B by PCR NEGATIVE NEGATIVE  Final    Comment: (NOTE) The Xpert Xpress SARS-CoV-2/FLU/RSV plus assay is intended as an aid in the diagnosis of influenza from Nasopharyngeal swab specimens and should not be used as a sole basis for treatment. Nasal washings and aspirates are unacceptable for Xpert Xpress SARS-CoV-2/FLU/RSV testing.  Fact Sheet for Patients: EntrepreneurPulse.com.au  Fact Sheet for Healthcare Providers: IncredibleEmployment.be  This test is not yet approved or cleared by the [place] FDA and has been authorized for detection and/or diagnosis of SARS-CoV-2 by FDA under an Emergency Use Authorization (EUA). This EUA will remain in effect (meaning this test can be used) for the duration of the COVID-19 declaration under Section 564(b)(1) of the Act, 21 U.S.C. section 360bbb-3(b)(1), unless the authorization is terminated or revoked.  Performed at [facility], [place] [address], [place], [place] [address]      Radiological Exams on Admission: CT HEAD WO CONTRAST (5MM)  Result Date: 09/17/2020 CLINICAL DATA:  Trauma. EXAM: CT HEAD WITHOUT CONTRAST CT CERVICAL SPINE WITHOUT CONTRAST TECHNIQUE: Multidetector CT imaging of the head and cervical spine was performed following the standard protocol without intravenous contrast. Multiplanar CT image reconstructions of the cervical spine were also generated. COMPARISON:  None. FINDINGS: CT HEAD FINDINGS Brain: Moderate age-related atrophy and chronic microvascular ischemic changes. 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 An area of infarct involving the left cerebellum, age indeterminate, possibly subacute. Acute infarct is not excluded clinical correlation is recommended. MRI may provide better evaluation if there is high clinical concern for acute infarct. There is no acute intracranial hemorrhage. No mass effect or midline shift. No extra-axial fluid collection. Vascular: No hyperdense vessel or unexpected  calcification. Skull: Normal. Negative for fracture or focal lesion. Sinuses/Orbits: No acute finding. Other: None CT CERVICAL SPINE FINDINGS Alignment: No acute subluxation. Skull base and vertebrae: Acute fracture.  Osteopenia. Soft tissues and spinal canal: No prevertebral fluid or swelling. No visible canal hematoma. Disc levels:  Degenerative changes primarily at C5-C6. Upper chest: Negative. Other: Bilateral carotid bulb calcified plaques. IMPRESSION: 1. No acute intracranial hemorrhage. 2. Age indeterminate left cerebellar infarct, possibly subacute. Clinical correlation is recommended. Old left occipital infarct. 3. No acute/traumatic cervical spine pathology. These results were called by telephone at the time of interpretation on 09/17/2020 at 1:58 am to provider [facility] , who verbally acknowledged these results. Electronically Signed   By: Anner Crete M.D.   On: 09/17/2020 02:09   CT Cervical Spine Wo Contrast  Result Date: 09/17/2020 CLINICAL DATA:  Trauma. EXAM: CT HEAD WITHOUT CONTRAST CT CERVICAL SPINE WITHOUT CONTRAST TECHNIQUE: Multidetector CT imaging of the head and cervical spine was performed following the standard protocol without intravenous contrast. Multiplanar CT image reconstructions of the cervical spine were also generated. COMPARISON:  None. FINDINGS: CT HEAD FINDINGS Brain: Moderate age-related atrophy and chronic microvascular ischemic changes. 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 An area of infarct involving the left cerebellum, age indeterminate, possibly subacute. Acute infarct is not excluded clinical correlation is recommended. MRI may provide better evaluation if there is high clinical concern for acute infarct. There is no acute intracranial hemorrhage. No mass effect or midline shift. No extra-axial fluid collection. Vascular: No hyperdense vessel or unexpected calcification. Skull: Normal. Negative for fracture or focal lesion. Sinuses/Orbits: No  acute finding. Other: None CT CERVICAL SPINE FINDINGS Alignment: No acute subluxation. Skull base and vertebrae: Acute fracture.  Osteopenia. Soft tissues and spinal canal: No prevertebral fluid or swelling. No visible canal hematoma. Disc levels:  Degenerative changes primarily at C5-C6. Upper chest: Negative. Other: Bilateral carotid bulb calcified plaques. IMPRESSION: 1. No acute intracranial hemorrhage. 2. Age indeterminate left cerebellar infarct, possibly subacute. Clinical correlation is recommended. Old left occipital infarct. 3. No acute/traumatic cervical spine pathology. These results were called by telephone at the time of interpretation on 09/17/2020 at 1:58 am to provider [facility] , who verbally acknowledged these results. Electronically Signed   By: Anner Crete M.D.   On: 09/17/2020 02:09   DG Pelvis Portable  Result Date: 09/17/2020 CLINICAL DATA:  Trauma. EXAM: PORTABLE PELVIS 1-2 VIEWS COMPARISON:  None. FINDINGS: There is no acute fracture or dislocation the bones are osteopenic. Degenerative changes of the lower lumbar spine. The soft tissues are unremarkable. IMPRESSION: No acute findings. Electronically Signed   By: Anner Crete M.D.   On: 09/17/2020 01:21   DG Chest Portable 1 View  Result Date: 09/17/2020 CLINICAL DATA:  Trauma. EXAM: PORTABLE CHEST 1 VIEW COMPARISON:  None. FINDINGS: Mild cardiomegaly. No focal consolidation or pneumothorax. Trace right pleural effusion present. Atherosclerotic calcification of the aorta. No acute osseous pathology. IMPRESSION: 1. Mild cardiomegaly. 2. Possible trace right pleural effusion.  No consolidation. Electronically Signed   By: Milas Hock  Radparvar M.D.   On: 09/17/2020 01:20    EKG: Independently reviewed.  Normal sinus rhythm RBBB.  Assessment/Plan Principal Problem:   Acute CVA (cerebrovascular accident) [facility]) Active Problems:   ARF (acute renal failure) (HCC)   Normocytic anemia    Acute CVA -discussed with on-call  neurologist.  Plan is to get MRA of the brain 2D echo carotid Doppler continue monitoring in telemetry swallow evaluation and if patient passes swallow we will start patient on diet check hemoglobin A1c lipid panel.  Patient's past medical history is not known we will need to get the contact number for his son or his caregiver to get further history. Acute renal failure -no old labs available.  Suspect it is acute.  Gently hydrate follow metabolic panel check UA. Anemia normocytic normochromic no old labs available.  Follow CBC check anemia panel. Elevated total bilirubin levels.  AST ALT and alk phosphatase are normal.  Follow LFTs. Patient's past medical history was unavailable at this time.  Will need to get further history once patient's son or family member or caregiver available.  COVID test is pending.   DVT prophylaxis: Lovenox. Code Status: Full code. Family Communication: Unable to reach family. Disposition Plan: Back to facility when stable. Consults called: Neurology.  Physical therapy. Admission status: Observation.   Rise Patience MD [facility] Pager [phone].  If 7PM-7AM, please contact night-coverage [email or web address] Password [email or web address]  09/17/2020, 6:04 AM

## 2020-09-17 NOTE — ED Provider Notes (Signed)
[facility] EMERGENCY DEPARTMENT Provider Note   CSN: [id number] Arrival date & time: 09/17/20  0045     History Chief Complaint  Patient presents with   Randall [name] is a 75 y.o. male.  The history is provided by the EMS personnel. The history is limited by the condition of the patient (level 5 caveat dementia).  Fall This is a new problem. The current episode started less than 1 hour ago. The problem occurs rarely. The problem has been resolved. Pertinent negatives include no chest pain, no abdominal pain, no headaches and no shortness of breath. Nothing aggravates the symptoms. Nothing relieves the symptoms. He has tried nothing for the symptoms.  Patient BIB EMS fall on blood thinners.  Denies pain.      History reviewed. No pertinent past medical history.  There are no problems to display for this patient.   History reviewed. No pertinent surgical history.     History reviewed. No pertinent family history.     Home Medications Prior to Admission medications   Not on File    Allergies    Patient has no known allergies.  Review of Systems   Review of Systems  Unable to perform ROS: Dementia  Constitutional:  Negative for fever.  HENT:  Negative for ear discharge.   Eyes:  Negative for redness.  Respiratory:  Negative for shortness of breath.   Cardiovascular:  Negative for chest pain.  Gastrointestinal:  Negative for abdominal pain.  Skin:  Negative for rash.  Neurological:  Negative for headaches.  Psychiatric/Behavioral:  Negative for agitation.    Physical Exam Updated Vital Signs BP (!) 145/55   Pulse 62   Temp (!) 97.2 F (36.2 C)   Resp 15   Ht 5\' 10"  (1.778 m)   Wt 97.5 kg   SpO2 97%   BMI 30.85 kg/m   Physical Exam Vitals and nursing note reviewed. Exam conducted with a chaperone present.  Constitutional:      General: He is not in acute distress.    Appearance: Normal appearance.  HENT:     Head:  Normocephalic.      Right Ear: Tympanic membrane normal.     Left Ear: Tympanic membrane normal.     Nose: Nose normal.  Eyes:     Extraocular Movements: Extraocular movements intact.     Conjunctiva/sclera: Conjunctivae normal.     Pupils: Pupils are equal, round, and reactive to light.  Cardiovascular:     Rate and Rhythm: Normal rate and regular rhythm.     Pulses: Normal pulses.     Heart sounds: Normal heart sounds.  Pulmonary:     Effort: Pulmonary effort is normal.     Breath sounds: Normal breath sounds.  Abdominal:     General: Abdomen is flat. Bowel sounds are normal.     Palpations: Abdomen is soft.     Tenderness: There is no abdominal tenderness. There is no guarding.  Musculoskeletal:        General: Normal range of motion.     Cervical back: Normal.     Thoracic back: Normal.     Lumbar back: Normal.     Right hip: Normal.     Left hip: Normal.     Right knee: Normal.     Left knee: Normal.     Right ankle: Normal.     Right Achilles Tendon: Normal.     Left ankle: Normal.  Left Achilles Tendon: Normal.     Right foot: Normal.     Left foot: Normal.  Lymphadenopathy:     Cervical: No cervical adenopathy.  Skin:    General: Skin is warm and dry.     Capillary Refill: Capillary refill takes less than 2 seconds.  Neurological:     General: No focal deficit present.     Mental Status: He is alert.     Deep Tendon Reflexes: Reflexes normal.  Psychiatric:        Mood and Affect: Mood normal.        Behavior: Behavior normal.    ED Results / Procedures / Treatments   Labs (all labs ordered are listed, but only abnormal results are displayed) Results for orders placed or performed during the hospital encounter of 09/17/20  CBC with Differential/Platelet  Result Value Ref Range   WBC 6.9 4.0 - 10.5 K/uL   RBC 4.45 4.22 - 5.81 MIL/uL   Hemoglobin 12.6 (L) 13.0 - 17.0 g/dL   HCT 16.1 09.6 - 04.5 %   MCV 89.9 80.0 - 100.0 fL   MCH 28.3 26.0 - 34.0 pg    MCHC 31.5 30.0 - 36.0 g/dL   RDW 40.9 81.1 - 91.4 %   Platelets 270 150 - 400 K/uL   nRBC 0.0 0.0 - 0.2 %   Neutrophils Relative % 77 %   Neutro Abs 5.3 1.7 - 7.7 K/uL   Lymphocytes Relative 10 %   Lymphs Abs 0.7 0.7 - 4.0 K/uL   Monocytes Relative 13 %   Monocytes Absolute 0.9 0.1 - 1.0 K/uL   Eosinophils Relative 0 %   Eosinophils Absolute 0.0 0.0 - 0.5 K/uL   Basophils Relative 0 %   Basophils Absolute 0.0 0.0 - 0.1 K/uL   Immature Granulocytes 0 %   Abs Immature Granulocytes 0.02 0.00 - 0.07 K/uL  Comprehensive metabolic panel  Result Value Ref Range   Sodium 138 135 - 145 mmol/L   Potassium 4.5 3.5 - 5.1 mmol/L   Chloride 103 98 - 111 mmol/L   CO2 23 22 - 32 mmol/L   Glucose, Bld 90 70 - 99 mg/dL   BUN 13 8 - 23 mg/dL   Creatinine, Ser 7.82 (H) 0.61 - 1.24 mg/dL   Calcium 8.7 (L) 8.9 - 10.3 mg/dL   Total Protein 5.9 (L) 6.5 - 8.1 g/dL   Albumin 2.8 (L) 3.5 - 5.0 g/dL   AST 27 15 - 41 U/L   ALT 15 0 - 44 U/L   Alkaline Phosphatase 83 38 - 126 U/L   Total Bilirubin 1.8 (H) 0.3 - 1.2 mg/dL   GFR, Estimated 57 (L) >60 mL/min   Anion gap 12 5 - 15   CT HEAD WO CONTRAST ( )  Result Date: 09/17/2020 CLINICAL DATA:  Trauma. EXAM: CT HEAD WITHOUT CONTRAST CT CERVICAL SPINE WITHOUT CONTRAST TECHNIQUE: Multidetector CT imaging of the head and cervical spine was performed following the standard protocol without intravenous contrast. Multiplanar CT image reconstructions of the cervical spine were also generated. COMPARISON:  None. FINDINGS: CT HEAD FINDINGS Brain: Moderate age-related atrophy and chronic microvascular ischemic changes. 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 An area of infarct involving the left cerebellum, age indeterminate, possibly subacute. Acute infarct is not excluded clinical correlation is recommended. MRI may provide better evaluation if there is high clinical concern for acute infarct. There is no acute intracranial hemorrhage. No mass effect or  midline shift. No extra-axial fluid collection. Vascular:  No hyperdense vessel or unexpected calcification. Skull: Normal. Negative for fracture or focal lesion. Sinuses/Orbits: No acute finding. Other: None CT CERVICAL SPINE FINDINGS Alignment: No acute subluxation. Skull base and vertebrae: Acute fracture.  Osteopenia. Soft tissues and spinal canal: No prevertebral fluid or swelling. No visible canal hematoma. Disc levels:  Degenerative changes primarily at C5-C6. Upper chest: Negative. Other: Bilateral carotid bulb calcified plaques. IMPRESSION: 1. No acute intracranial hemorrhage. 2. Age indeterminate left cerebellar infarct, possibly subacute. Clinical correlation is recommended. Old left occipital infarct. 3. No acute/traumatic cervical spine pathology. These results were called by telephone at the time of interpretation on 09/17/2020 at 1:58 am to [facility] , who verbally acknowledged these results. Electronically Signed   By: Elgie CollardArash  Radparvar M.D.   On: 09/17/2020 02:09   CT Cervical Spine Wo Contrast  Result Date: 09/17/2020 CLINICAL DATA:  Trauma. EXAM: CT HEAD WITHOUT CONTRAST CT CERVICAL SPINE WITHOUT CONTRAST TECHNIQUE: Multidetector CT imaging of the head and cervical spine was performed following the standard protocol without intravenous contrast. Multiplanar CT image reconstructions of the cervical spine were also generated. COMPARISON:  None. FINDINGS: CT HEAD FINDINGS Brain: Moderate age-related atrophy and chronic microvascular ischemic changes. 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 An area of infarct involving the left cerebellum, age indeterminate, possibly subacute. Acute infarct is not excluded clinical correlation is recommended. MRI may provide better evaluation if there is high clinical concern for acute infarct. There is no acute intracranial hemorrhage. No mass effect or midline shift. No extra-axial fluid collection. Vascular: No hyperdense vessel or unexpected  calcification. Skull: Normal. Negative for fracture or focal lesion. Sinuses/Orbits: No acute finding. Other: None CT CERVICAL SPINE FINDINGS Alignment: No acute subluxation. Skull base and vertebrae: Acute fracture.  Osteopenia. Soft tissues and spinal canal: No prevertebral fluid or swelling. No visible canal hematoma. Disc levels:  Degenerative changes primarily at C5-C6. Upper chest: Negative. Other: Bilateral carotid bulb calcified plaques. IMPRESSION: 1. No acute intracranial hemorrhage. 2. Age indeterminate left cerebellar infarct, possibly subacute. Clinical correlation is recommended. Old left occipital infarct. 3. No acute/traumatic cervical spine pathology. These results were called by telephone at the time of interpretation on 09/17/2020 at 1:58 am to provider [facility] , who verbally acknowledged these results. Electronically Signed   By: Elgie CollardArash  Radparvar M.D.   On: 09/17/2020 02:09   DG Pelvis Portable  Result Date: 09/17/2020 CLINICAL DATA:  Trauma. EXAM: PORTABLE PELVIS 1-2 VIEWS COMPARISON:  None. FINDINGS: There is no acute fracture or dislocation the bones are osteopenic. Degenerative changes of the lower lumbar spine. The soft tissues are unremarkable. IMPRESSION: No acute findings. Electronically Signed   By: Elgie CollardArash  Radparvar M.D.   On: 09/17/2020 01:21   DG Chest Portable 1 View  Result Date: 09/17/2020 CLINICAL DATA:  Trauma. EXAM: PORTABLE CHEST 1 VIEW COMPARISON:  None. FINDINGS: Mild cardiomegaly. No focal consolidation or pneumothorax. Trace right pleural effusion present. Atherosclerotic calcification of the aorta. No acute osseous pathology. IMPRESSION: 1. Mild cardiomegaly. 2. Possible trace right pleural effusion.  No consolidation. Electronically Signed   By: Elgie CollardArash  Radparvar M.D.   On: 09/17/2020 01:20    EKG None  Radiology CT HEAD WO CONTRAST (5MM)  Result Date: 09/17/2020 CLINICAL DATA:  Trauma. EXAM: CT HEAD WITHOUT CONTRAST CT CERVICAL SPINE WITHOUT CONTRAST  TECHNIQUE: Multidetector CT imaging of the head and cervical spine was performed following the standard protocol without intravenous contrast. Multiplanar CT image reconstructions of the cervical spine were also generated. COMPARISON:  None. FINDINGS:  CT HEAD FINDINGS Brain: Moderate age-related atrophy and chronic microvascular ischemic changes. 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 An area of infarct involving the left cerebellum, age indeterminate, possibly subacute. Acute infarct is not excluded clinical correlation is recommended. MRI may provide better evaluation if there is high clinical concern for acute infarct. There is no acute intracranial hemorrhage. No mass effect or midline shift. No extra-axial fluid collection. Vascular: No hyperdense vessel or unexpected calcification. Skull: Normal. Negative for fracture or focal lesion. Sinuses/Orbits: No acute finding. Other: None CT CERVICAL SPINE FINDINGS Alignment: No acute subluxation. Skull base and vertebrae: Acute fracture.  Osteopenia. Soft tissues and spinal canal: No prevertebral fluid or swelling. No visible canal hematoma. Disc levels:  Degenerative changes primarily at C5-C6. Upper chest: Negative. Other: Bilateral carotid bulb calcified plaques. IMPRESSION: 1. No acute intracranial hemorrhage. 2. Age indeterminate left cerebellar infarct, possibly subacute. Clinical correlation is recommended. Old left occipital infarct. 3. No acute/traumatic cervical spine pathology. These results were called by telephone at the time of interpretation on 09/17/2020 at 1:58 am to provider [facility] , who verbally acknowledged these results. Electronically Signed   By: Elgie Collard M.D.   On: 09/17/2020 02:09   CT Cervical Spine Wo Contrast  Result Date: 09/17/2020 CLINICAL DATA:  Trauma. EXAM: CT HEAD WITHOUT CONTRAST CT CERVICAL SPINE WITHOUT CONTRAST TECHNIQUE: Multidetector CT imaging of the head and cervical spine was performed following  the standard protocol without intravenous contrast. Multiplanar CT image reconstructions of the cervical spine were also generated. COMPARISON:  None. FINDINGS: CT HEAD FINDINGS Brain: Moderate age-related atrophy and chronic microvascular ischemic changes. 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 An area of infarct involving the left cerebellum, age indeterminate, possibly subacute. Acute infarct is not excluded clinical correlation is recommended. MRI may provide better evaluation if there is high clinical concern for acute infarct. There is no acute intracranial hemorrhage. No mass effect or midline shift. No extra-axial fluid collection. Vascular: No hyperdense vessel or unexpected calcification. Skull: Normal. Negative for fracture or focal lesion. Sinuses/Orbits: No acute finding. Other: None CT CERVICAL SPINE FINDINGS Alignment: No acute subluxation. Skull base and vertebrae: Acute fracture.  Osteopenia. Soft tissues and spinal canal: No prevertebral fluid or swelling. No visible canal hematoma. Disc levels:  Degenerative changes primarily at C5-C6. Upper chest: Negative. Other: Bilateral carotid bulb calcified plaques. IMPRESSION: 1. No acute intracranial hemorrhage. 2. Age indeterminate left cerebellar infarct, possibly subacute. Clinical correlation is recommended. Old left occipital infarct. 3. No acute/traumatic cervical spine pathology. These results were called by telephone at the time of interpretation on 09/17/2020 at 1:58 am to provider [facility] , who verbally acknowledged these results. Electronically Signed   By: Elgie Collard M.D.   On: 09/17/2020 02:09   DG Pelvis Portable  Result Date: 09/17/2020 CLINICAL DATA:  Trauma. EXAM: PORTABLE PELVIS 1-2 VIEWS COMPARISON:  None. FINDINGS: There is no acute fracture or dislocation the bones are osteopenic. Degenerative changes of the lower lumbar spine. The soft tissues are unremarkable. IMPRESSION: No acute findings. Electronically  Signed   By: Elgie Collard M.D.   On: 09/17/2020 01:21   DG Chest Portable 1 View  Result Date: 09/17/2020 CLINICAL DATA:  Trauma. EXAM: PORTABLE CHEST 1 VIEW COMPARISON:  None. FINDINGS: Mild cardiomegaly. No focal consolidation or pneumothorax. Trace right pleural effusion present. Atherosclerotic calcification of the aorta. No acute osseous pathology. IMPRESSION: 1. Mild cardiomegaly. 2. Possible trace right pleural effusion.  No consolidation. Electronically Signed   By: Elgie Collard  M.D.   On: 09/17/2020 01:20    Procedures Procedures   Medications Ordered in ED Medications  Tdap (BOOSTRIX) injection 0.5 mL (0.5 mLs Intramuscular Not Given 09/17/20 0250)    ED Course  I have reviewed the triage vital signs and the nursing notes.  Pertinent labs & imaging results that were available during my care of the patient were reviewed by me and considered in my medical decision making (see chart for details).  Humbert Morozov was evaluated in Emergency Department on 09/17/2020 for the symptoms described in the history of present illness. He was evaluated in the context of the global COVID-19 pandemic, which necessitated consideration that the patient might be at risk for infection with the SARS-CoV-2 virus that causes COVID-19. Institutional protocols and algorithms that pertain to the evaluation of patients at risk for COVID-19 are in a state of rapid change based on information released by regulatory bodies including the CDC and federal and state organizations. These policies and algorithms were followed during the patient's care in the ED.  Final Clinical Impression(s) / ED Diagnoses Final diagnoses:  None   Admit to medicine  Rx / DC Orders ED Discharge Orders     None        Janye Maynor, MD 09/17/20 [phone]

## 2020-09-17 NOTE — Progress Notes (Signed)
Pt arrived on the unit. Alert oriented to x2, grumpy, refuses to answer some questions. Education was provided about importance of properly answering question to see the change in neuro status. Cooperative, but stubborn. Pt transferred to CT. Neuro MD at the bedside , NIH was performed. Pt agreed to go to MRI only if "sleeping pill was given to him". MD ordered Ativan once 1mg , was given prior to transfer. At MRI refused the test again. Education provided about importance. Pt refused, and transferred back to the room.  No neuro changes since. VS stable, q2hr at this time.  The [facility]  facility was called. Stated that he just returned from the rehab, and taking Eliquis 5 mg twice a day. Last dose 8/29 am.  MD notified about MRI situation and aware. Bed in low position, alarms are on. Call bell in reach.

## 2020-09-17 NOTE — Evaluation (Signed)
Speech Language Pathology Evaluation Patient Details Name: Randall [name] MRN: [id number] DOB: 10-07-1945 Today's Date: 09/17/2020 Time: 2035-5974 SLP Time Calculation (min) (ACUTE ONLY): 13 min  Problem List:  Patient Active Problem List   Diagnosis Date Noted   Acute CVA (cerebrovascular accident) (HCC) 09/17/2020   ARF (acute renal failure) (HCC) 09/17/2020   Normocytic anemia 09/17/2020   Past Medical History: History reviewed. No pertinent past medical history. Past Surgical History: History reviewed. No pertinent surgical history. HPI:  75 y.o. 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 He was outside of the window for IVTPA and thrombectomy. Pt pending MRI of head (uncooperative for completion per RN x2 attempts).   Assessment / Plan / Recommendation Clinical Impression  Pt presents with current cognitive deficits appearing at least mild to moderate in nature. Unclear of cognitive baseline, no family member or caregiver present and pt an unreliable historian; suspect however worsening per chart review and clinical interactions with pt. Pt oriented to self only. With environmental cues he was able to improve immediate orientation however delayed and short term recall also noted to be impaired with poor pt carryover. Pt exhibited reduced thought organization, poor executive function skills, and reduced problem solving. Receptive and expressive language skills as well as motor speech skills appeared grossly intact. Will follow up for cognitive assessment and intervention and clarification of baseline functioning.    SLP Assessment  SLP Recommendation/Assessment: Patient needs continued Speech Lanaguage Pathology Services SLP Visit Diagnosis: Cognitive communication deficit (R41.841)    Follow Up Recommendations  Skilled Nursing facility;24 hour supervision/assistance    Frequency and Duration min 1 x/week   1 week      SLP Evaluation Cognition  Overall Cognitive Status: No family/caregiver present to determine baseline cognitive functioning (suspect worsening per clinical interaction and chart review) Arousal/Alertness: Awake/alert Orientation Level: Oriented to person;Disoriented to place;Disoriented to time;Disoriented to situation Attention: Selective;Alternating Selective Attention: Impaired Selective Attention Impairment: Verbal complex;Functional complex Alternating Attention: Impaired Alternating Attention Impairment: Verbal complex;Functional complex Memory: Impaired Memory Impairment: Decreased recall of new information;Decreased short term memory Awareness: Impaired Problem Solving: Impaired Problem Solving Impairment: Verbal basic;Verbal complex;Functional basic;Functional complex Executive Function: Reasoning;Self Monitoring Reasoning: Impaired Self Monitoring: Impaired Safety/Judgment: Impaired       Comprehension  Auditory Comprehension Overall Auditory Comprehension: Appears within functional limits for tasks assessed    Expression Expression Primary Mode of Expression: Verbal Verbal Expression Overall Verbal Expression: Appears within functional limits for tasks assessed Written Expression Dominant Hand: Right   Oral / Motor  Motor Speech Overall Motor Speech: Appears within functional limits for tasks assessed   GO                    Ardyth Gal MA, CCC-SLP Acute Rehabilitation Services   09/17/2020, 4:26 PM

## 2020-09-17 NOTE — Consult Note (Addendum)
NEUROLOGY CONSULTATION NOTE   Date of service: September 17, 2020 Patient Name: Randall [name] MRN:  [id number] DOB:  Jun 22, 1945 Reason for consult: "Cerebellar stroke" Requesting Provider: Cy Blamer, MD _ _ _   _ __   _ __ _ _  __ __   _ __   __ _  History of Present Illness  Randall [name] is a 75 y.o. male with unknown PMH significant who presented from [facility] via EMS with a fall out of bed. Appears to have rolled out of his bed and hit head on recliner with small laceration on top of his head with minimal bleeding. He is reported to be on blood thinners but unsure of what he takes.  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  He is new to our system with no prior records. He endorses hx of prior stroke about a year ago with problems in his Right vision. Reports that someone else arranges his medications at his facility so he is not sure what blood thinner he takes. He does think that he takes a baby aspirin at his facility. He does not smoke, no alcohol, does not recall family hx of stroke.  He denies any arm or leg weakness or numbness, no new vision changes, no facial droop, no vertigo, no dysequilibrium, no nausea, no vomiting, no headache.  He wants to be left alone and declined to answer any more questions.  mRS: 4 Outside tPA/thrombectomy window LKW: Unable to establish as he has no new symptoms. NIHSS components Score: Comment  1a Level of Conscious 0[x]  1[]  2[]  3[]      1b LOC Questions 0[x]  1[]  2[]       1c LOC Commands 0[x]  1[]  2[]       2 Best Gaze 0[x]  1[]  2[]       3 Visual 0[]  1[x]  2[]  3[]      4 Facial Palsy 0[x]  1[]  2[]  3[]      5a Motor Arm - left 0[x]  1[]  2[]  3[]  4[]  UN[]    5b Motor Arm - Right 0[x]  1[]  2[]  3[]  4[]  UN[]    6a Motor Leg - Left 0[x]  1[]  2[]  3[]  4[]  UN[]    6b Motor Leg - Right 0[x]  1[]  2[]  3[]  4[]  UN[]    7 Limb Ataxia 0[x]  1[]  2[]  3[]  UN[]     8 Sensory 0[x]  1[]  2[]  UN[]       9 Best Language 0[x]  1[]  2[]  3[]      10 Dysarthria 0[x]  1[]  2[]  UN[]      11 Extinct. and Inattention 0[x]  1[]  2[]       TOTAL: 1     ROS   Unable to get a detailed ROS as he declines to answer most of my questions. He denies any headache. Unable to obtain detailed PMH, PSH, Fhx, Shx as he again as he declines to answer most of my questions.  Past History  History reviewed. No pertinent past medical history. History reviewed. No pertinent surgical history. History reviewed. No pertinent family history. Social History   Socioeconomic History   Marital status: Unknown    Spouse name: Not on file   Number of children: Not on file   Years of education: Not on file   Highest education level: Not on file  Occupational History   Not on file  Tobacco Use   Smoking status: Not on file   Smokeless tobacco: Not on file  Substance and Sexual Activity   Alcohol use: Not on  file   Drug use: Not on file   Sexual activity: Not on file  Other Topics Concern   Not on file  Social History Narrative   Not on file   Social Determinants of Health   Financial Resource Strain: Not on file  Food Insecurity: Not on file  Transportation Needs: Not on file  Physical Activity: Not on file  Stress: Not on file  Social Connections: Not on file   No Known Allergies  Medications  (Not in a hospital admission)    Vitals   Vitals:   09/17/20 0330 09/17/20 0345 09/17/20 0415 09/17/20 0430  BP: (!) 130/46 (!) 160/66 (!) 168/71 140/65  Pulse: 61 84 74 74  Resp: 18 17 17 15   Temp:      SpO2: 99% 99% 98% 96%  Weight:      Height:         Body mass index is 30.85 kg/m.  Physical Exam   General: Laying comfortably in bed; in no acute distress.  HENT: Normal oropharynx and mucosa. Normal external appearance of ears and nose.  Neck: Supple, no pain or tenderness  CV: No JVD. No peripheral edema.  Pulmonary: Symmetric Chest rise. Normal respiratory effort.  Abdomen: Soft to touch,  non-tender.  Ext: No cyanosis, edema, or deformity  Skin: No rash. Normal palpation of skin.   Musculoskeletal: Normal digits and nails by inspection. No clubbing.   Neurologic Examination  Mental status/Cognition: Alert, oriented to self, place, month and year, good attention.  Speech/language: Fluent, comprehension intact, object naming intact, repetition intact.  Cranial nerves:   CN II Pupils equal and reactive to light, mild R hemianopsia.   CN III,IV,VI EOM intact, no gaze preference or deviation, no nystagmus    CN V normal sensation in V1, V2, and V3 segments bilaterally    CN VII no asymmetry, no nasolabial fold flattening    CN VIII normal hearing to speech    CN IX & X normal palatal elevation, no uvular deviation    CN XI 5/5 head turn and 5/5 shoulder shrug bilaterally    CN XII midline tongue protrusion    Motor:  Muscle bulk: poor, tone normal Mvmt Root Nerve  Muscle Right Left Comments  SA C5/6 Ax Deltoid     EF C5/6 Mc Biceps 4+ 4+   EE C6/7/8 Rad Triceps 4+ 4+   WF C6/7 Med FCR     WE C7/8 PIN ECU     F Ab C8/T1 U ADM/FDI 4+ 4+   HF L1/2/3 Fem Illopsoas 4 4 Declined full testing but agreed to voluntarily lift and hold off the bed.  KE L2/3/4 Fem Quad     DF L4/5 D Peron Tib Ant     PF S1/2 Tibial Grc/Sol      Reflexes:  Right Left Comments  Pectoralis      Biceps (C5/6) 2 2   Brachioradialis (C5/6) 2 2    Triceps (C6/7) 2 2    Patellar (L3/4) 2 2    Achilles (S1)      Hoffman      Plantar     Jaw jerk    Sensation:  Light touch intact   Pin prick    Temperature    Vibration   Proprioception    Coordination/Complex Motor:  - Finger to Nose grossly intact BL - Heel to shin unable to assess as he declined. - Rapid alternating movement are slowed in LUE - Gait: unable to assess.  Labs   CBC:  Recent Labs  Lab 09/17/20 0300  WBC 6.9  NEUTROABS 5.3  HGB 12.6*  HCT 40.0  MCV 89.9  PLT 270    Basic Metabolic Panel:  Lab Results   Component Value Date   NA 138 09/17/2020   K 4.5 09/17/2020   CO2 23 09/17/2020   GLUCOSE 90 09/17/2020   BUN 13 09/17/2020   CREATININE 1.30 (H) 09/17/2020   CALCIUM 8.7 (L) 09/17/2020   GFRNONAA 57 (L) 09/17/2020   Lipid Panel: No results found for: LDLCALC HgbA1c: No results found for: HGBA1C Urine Drug Screen: No results found for: LABOPIA, COCAINSCRNUR, LABBENZ, AMPHETMU, THCU, LABBARB  Alcohol Level No results found for: ETH  CT Head without contrast(personally reviewed): - No acute intracranial hemorrhage. - Age indeterminate left cerebellar infarct, possibly subacute. Clinical correlation is recommended. Old left occipital infarct. - No acute/traumatic cervical spine pathology.  MR Angio head without contrast and Carotid Duplex BL: pending  MRI Brain: pending Impression   Randall [name] is a 75 y.o. 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 His neurologic examination is notable for chronic R hemianopsia, no other significant focal deficit but also refused full neuro exam.  The appearance of the storke is most consistent with a subacute L cerebellar stroke, no significant edema or mass effect noted.  He refused MRI Brain at this time, more open to in when he wakes up.  Recommendations  Plan:  Left Cerebellar stroke: - Frequent Neuro checks per stroke unit protocol - Recommend brain imaging with MRI Brain without contrast - Recommend Vascular imaging with MRA Angio Head without contrast and US Carotid doppler - Recommend obtaining TTE - Recommend obtaining Lipid panel with LDL - Please start statin if LDL > 70 - Recommend HbA1c - Antithrombotic - aspirin 81mg  daily. - Recommend DVT ppx - SBP goal - permissive hypertension first 24 h < 220/110. Held home meds.  - Recommend Telemetry monitoring for  arrythmia - Recommend bedside swallow screen prior to PO intake. - Stroke education booklet - Recommend PT/OT/SLP consult  __________________________________________________________________  Plan discussed with Dr. and with Dr. Nicanor Alcon  Thank you for the opportunity to take part in the care of this patient. If you have any further questions, please contact the neurology consultation attending.  Signed,  Toniann Fail [facility] Pager Number Erick Blinks _ _ _   _ __   _ __ _ _  __ __   _ __   __ _

## 2020-09-17 NOTE — Progress Notes (Signed)
STROKE TEAM PROGRESS NOTE    Interval History   No acute events overnight, patient is resting in bed, no family at bedside. He states that he is tired and is reluctant to participate in the neurological examination.  Patient states he fell out of bed and did not on what happened.  He denies any headache or focal neurological symptoms.  Educated about his stroke, need for stroke work up and MRI that needs to be done.    Pertinent Lab Work and Imaging    09/17/20 CT Head WO IV Contrast 1. No acute intracranial hemorrhage. 2. Age indeterminate left cerebellar infarct, possibly subacute. Clinical correlation is recommended. Old left occipital infarct. 3. No acute/traumatic cervical spine pathology.  CT Angio Head and Neck W WO IV Contrast No occlusion in the neck.  Mild plaque at the ICA origins.   Significant intracranial atherosclerosis. 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 There is focal high-grade stenosis of the left A2 ACA, right M1 MCA, and left P2 and P3 PCA. Additional distal ACA and MCA branch stenoses bilaterally.  MRI Brain WO IV Contrast Ordered, pending   Echocardiogram Complete  Ordered, pending   Physical Examination   Constitutional: Calm, appropriate for condition .  He has left parietal swelling and abrasion on his lips and face Cardiovascular: Normal RR Respiratory: No increased WOB   Mental status: Patient is sleepy and keeps eyes closed but can be aroused to follow commands oriented to time place and person. Speech: Fluent. Repetition and naming not assessed  Cranial nerves: EOMI, Right dense visual field cut, Left facial droop, Tongue midline  Motor: Normal bulk and tone. No drift. He is antigravity throughout.  Diminished fine finger movements on the left but doubt cooperation. Sensory: Intact to light touch throughout  Coordination: Diminished fine finger movements to the left Gait: Deferred  Assessment and Plan   Mr.  Randall [name] is a 75 y.o. 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 He was outside of the window for IVTPA and thrombectomy.  #L Cerebellar hypodensity  ? subacute infarct versus contusion related to fall   #Prior L Occipital Stroke  Patient presented with the symptoms described above. At this time, stroke work up is ongoing. 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 Vessel imaging was obtained with CTA Head and Neck, this showed significant intracranial atherosclerosis; moderate to marked stenosis of the distal right vertebral artery. High grade stenosis of the left P2 and P3 PCA. Stroke labs w/LDL 59, hemoglobin a1c 8.0. Waiting on MRI Brain and Echo. Stroke etiology is cryptogenic, atheroembolism considering posterior circulation stenosis versus cardioembolic  - DAPT 3 months followed by Plavix monotherapy  - LDL at goal, no need for statin therapy  - At discharge please place ambulatory referral to neurology for stroke follow up   #Hypertension No documented history of hypertension however pressures are trending 140-170. Unclear last known well, no need for permissive hypertension at this time. Recommend to target normotension avoiding acute blood pressure drops.   #Hyperlipidemia From a stroke prevention stand point, the LDL goal is < 70. LDL is at goal at 59, no need for statins   #Stroke Diabetes Screening Hemoglobin A1C this admission noted to be 8.0, he is diabetic. Recommend SSI management while inpatient and follow up with PCP at discharge    Hospital day # 0  Stark Jock, NP  [facility] Nurse Practitioner Patient seen and discussed with attending physician Dr. Priscille Heidelberg MD NOTE :  I have personally obtained history,examined this patient, reviewed notes, independently viewed imaging studies, participated in medical decision making and plan  of care.ROS completed by me personally and pertinent positives fully documented  I have made any additions or clarifications directly to the above note. Agree with note above.  Patient fell out of bed and has a facial abrasion and CT scan shows left cerebellar hypodensity unclear as to whether this represents subacute infarct or contusion.  Due to his fall.  Recommend check MRI scan of the brain for further clarification on this issue.  Check echocardiogram.  Continue ongoing stroke work-up.  Greater than 50% time during this 35-minute visit was spent on counseling and coordination of care about his stroke and answering questions.  Discussed with Dr. Doristine Counter, MD Medical Director [facility] Pager: [phone] 09/17/2020 2:07 PM  To contact Stroke Continuity provider, please refer to [email or web address]. After hours, contact General Neurology

## 2020-09-17 NOTE — Progress Notes (Signed)
*  PRELIMINARY RESULTS* Echocardiogram 2D Echocardiogram has been performed.  Neomia Dear RDCS 09/17/2020, 12:45 PM

## 2020-09-17 NOTE — ED Notes (Signed)
Attempted to call nursing facility 3 times with no answer

## 2020-09-17 NOTE — ED Notes (Signed)
Pt comes via [facility] and rolled out of bed, hit head on recliner, small lac to top of head, minimal bleeding , no LOC, AxO x 4, denies neck or back pain, on blood thinners.

## 2020-09-18 ENCOUNTER — Observation Stay (HOSPITAL_COMMUNITY): Payer: Medicare Other

## 2020-09-18 LAB — BASIC METABOLIC PANEL
Anion gap: 8 (ref 5–15)
BUN: 14 mg/dL (ref 8–23)
CO2: 24 mmol/L (ref 22–32)
Calcium: 8.4 mg/dL — ABNORMAL LOW (ref 8.9–10.3)
Chloride: 107 mmol/L (ref 98–111)
Creatinine, Ser: 1.17 mg/dL (ref 0.61–1.24)
GFR, Estimated: >60 mL/min (ref >60)
Glucose, Bld: 93 mg/dL (ref 70–99)
Potassium: 3.8 mmol/L (ref 3.5–5.1)
Sodium: 139 mmol/L (ref 135–145)

## 2020-09-18 LAB — CBC
HCT: 36.2 % — ABNORMAL LOW (ref 39.0–52.0)
Hemoglobin: 11.7 g/dL — ABNORMAL LOW (ref 13.0–17.0)
MCH: 28.3 pg (ref 26.0–34.0)
MCHC: 32.3 g/dL (ref 30.0–36.0)
MCV: 87.7 fL (ref 80.0–100.0)
Platelets: 220 10*3/uL (ref 150–400)
RBC: 4.13 MIL/uL — ABNORMAL LOW (ref 4.22–5.81)
RDW: 13.6 % (ref 11.5–15.5)
WBC: 5.5 10*3/uL (ref 4.0–10.5)
nRBC: 0 % (ref 0.0–0.2)

## 2020-09-18 LAB — MAGNESIUM: Magnesium: 1.8 mg/dL (ref 1.7–2.4)

## 2020-09-18 MED ORDER — LORAZEPAM 0.5 MG PO TABS
0.5000 mg | ORAL_TABLET | Freq: Once | ORAL | Status: AC | PRN
  Administered 2020-09-18: 0.5 mg via ORAL
  Filled 2020-09-18: qty 1

## 2020-09-18 MED ORDER — APIXABAN 5 MG PO TABS
5.0000 mg | ORAL_TABLET | Freq: Two times a day (BID) | ORAL | Status: DC
  Administered 2020-09-18 – 2020-09-19 (×2): 5 mg via ORAL
  Filled 2020-09-18 (×3): qty 1

## 2020-09-18 NOTE — Progress Notes (Signed)
Occupational Therapy Treatment Patient Details Name: Randall [name] MRN: [id number] DOB: May 13, 1945 Today's Date: 09/18/2020    History of present illness Pt is a 75yo male from [facility] fell out of bed and hit head on recliner. 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 Labs reveal acute renal failure and anemia. PMH: unknown at this time.   OT comments  Patient with incremental gains towards patient focused goal.  Patient was disgruntled about sitting up at the edge of the bed, and declined any attempts at out of bed to recliner, mobility, or ADL attempt.  Neuro MD in room, and patient returned to bed with alarm on and needs in reach.  Patient would benefit from out of bed and participation, but only agreeable to napping at this time.  OT offered to try later, but he flatly declined.  OT to continue efforts, but look to discharge OT to next level if he continues to decline.  Discharge plan is to return to ALF with increased assist as needed from the facility.    Follow Up Recommendations  SNF;Other (comment) (ALF with increased assist)    Equipment Recommendations  None recommended by OT    Recommendations for Other Services      Precautions / Restrictions Precautions Precautions: Fall Restrictions Weight Bearing Restrictions: No       Mobility Bed Mobility Overal bed mobility: Needs Assistance Bed Mobility: Supine to Sit;Sit to Supine     Supine to sit: Mod assist Sit to supine: Mod assist     Patient Response: Flat affect  Transfers                 General transfer comment: declined OOB    Balance Overall balance assessment: Needs assistance Sitting-balance support: Single extremity supported;Feet supported Sitting balance-Leahy Scale: Poor   Postural control: Posterior lean                                   Cognition Arousal/Alertness:  Awake/alert;Lethargic Behavior During Therapy: Flat affect Overall Cognitive Status: No family/caregiver present to determine baseline cognitive functioning                                                            Pertinent Vitals/ Pain       Pain Assessment: No/denies pain                                                          Frequency  Min 2X/week        Progress Toward Goals  OT Goals(current goals can now be found in the care plan section)     Acute Rehab OT Goals Patient Stated Goal: return to his nap OT Goal Formulation: Patient unable to participate in goal setting Time For Goal Achievement: 10/01/20 Potential to Achieve Goals: Good  Plan Discharge plan remains appropriate    Co-evaluation  AM-PAC OT "6 Clicks" Daily Activity     Outcome Measure   Help from another person eating meals?: A Lot Help from another person taking care of personal grooming?: A Lot   Help from another person bathing (including washing, rinsing, drying)?: A Lot Help from another person to put on and taking off regular upper body clothing?: A Lot Help from another person to put on and taking off regular lower body clothing?: A Lot 6 Click Score: 10    End of Session    OT Visit Diagnosis: Unsteadiness on feet (R26.81);Muscle weakness (generalized) (M62.81);Repeated falls (R29.6)   Activity Tolerance Patient limited by lethargy   Patient Left in bed;with call bell/phone within reach;with bed alarm set   Nurse Communication          Time: 4259-5638 OT Time Calculation (min): 13 min  Charges: OT General Charges $OT Visit: 1 Visit OT Treatments $Therapeutic Activity: 8-22 mins  09/18/2020  RP, OTR/L  Acute Rehabilitation Services  Office:  [phone]    Suzanna Obey 09/18/2020, 11:19 AM

## 2020-09-18 NOTE — Progress Notes (Signed)
PROGRESS NOTE  Randall [name] STM:[id number] DOB: November 21, 1945 DOA: 09/17/2020 PCP: Pcp, No   LOS: 0 days   Brief Narrative / Interim history: Randall [name] is a 75 y.o. male presented to the hospital with  falling and hitting his face on the floor.  Patient was a poor historian did not  remember the event but did not lose consciousness.  In the ER, patient has a laceration on his face from the fall.  CT head and C-spine done showed subacute stroke on the left cerebellar area and old stroke in the left occipital area.  EKG showed normal sinus rhythm with RBBB.  Labs show elevated creatinine of 1.3 albumin 2.8 total bilirubin 1.8.  COVID test was negative.  No old labs to compare.  Patient was seen by neurology and was admitted for further work-up of stroke.  Subjective / 24h Interval events: Flat affect, no complaints, no chest pain, no shortness of breath  Assessment & Plan: Principal Problem Acute stroke.   Neurology on board.  Spoke with Dr. Pearlean Brownie.  CT head scan showed age-indeterminate left cerebellar infarct.  CT cervical spine showed  indeterminate left cerebellar infarct.  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  MRI of the brain has been ordered and still pending this morning.  Hemoglobin A1c of 8.0. -PT recommends SNF   Active Problems Status post fall with laceration in the face. Continue supportive care.  Check PT   Acute renal failure  No old labs to compare.  Creatinine of 1.3 on admission, currently 1.1, suspect at baseline   Anemia normocytic normochromic  Ferritin 99, iron at 37 and TIBC low at 185.  Vitamin B12 and folic acid acid within normal limits.  Follow hemoglobin.   Elevated total bilirubin levels.   AST ALT and alkaline phosphorus normal.  Follow LFTs.  Scheduled Meds:  aspirin  300 mg Rectal Daily   Or   aspirin  325 mg Oral Daily    enoxaparin (LOVENOX) injection  40 mg Subcutaneous Daily   Tdap  0.5 mL Intramuscular Once   Continuous Infusions: PRN Meds:.acetaminophen **OR** acetaminophen (TYLENOL) oral liquid 160 mg/5 mL **OR** acetaminophen, hydrALAZINE, melatonin  Diet Orders (From admission, onward)     Start     Ordered   09/17/20 1956  Diet Heart Room service appropriate? Yes; Fluid consistency: Thin  Diet effective now       Question Answer Comment  Room service appropriate? Yes   Fluid consistency: Thin      09/17/20 1955            DVT prophylaxis: enoxaparin (LOVENOX) injection 40 mg Start: 09/17/20 1000     Code Status: DNR  Family Communication: no family at bedside   Status is: Observation  The patient will require care spanning > 2 midnights and should be moved to inpatient because: Inpatient level of care appropriate due to severity of illness  Dispo: The patient is from: ALF              Anticipated d/c is to: SNF              Patient currently is not medically stable to d/c.   Difficult to place patient No  Level of care: Telemetry Medical  Consultants:  Neurology   Procedures:  none  Microbiology  none  Antimicrobials: none  Objective: Vitals:   09/17/20 1940 09/17/20 2324 09/18/20 0404 09/18/20 0741  BP: (!) 180/75 (!) 156/62 (!) 165/63 (!) 156/63  Pulse: 70 62 (!) 56 (!) 53  Resp: 19 17 19 18   Temp: 98.6 F (37 C) 98 F (36.7 C) 98.2 F (36.8 C) 98.7 F (37.1 C)  TempSrc: Oral Oral Oral Oral  SpO2: 96% 93% 95% 94%  Weight:      Height:        Intake/Output Summary (Last 24 hours) at 09/18/2020 1152 Last data filed at 09/17/2020 2324 Gross per 24 hour  Intake 748.24 ml  Output 750 ml  Net -1.76 ml   Filed Weights   09/17/20 0051  Weight: 97.5 kg    Examination:  Constitutional: NAD Eyes: no scleral icterus ENMT: Mucous membranes are moist.  Neck: normal, supple Respiratory: clear to auscultation bilaterally, no wheezing, no crackles.  Normal respiratory effort. No accessory muscle use.  Cardiovascular: Regular rate and rhythm, no murmurs / rubs / gallops.  Abdomen: non distended, no tenderness. Bowel sounds positive.  Musculoskeletal: no clubbing / cyanosis.  Skin: no rashes Neurologic: non focal    Data Reviewed: I have independently reviewed following labs and imaging studies   CBC: Recent Labs  Lab 09/17/20 0300 09/17/20 0645 09/18/20 0345  WBC 6.9 6.6 5.5  NEUTROABS 5.3  --   --   HGB 12.6* 11.9* 11.7*  HCT 40.0 36.7* 36.2*  MCV 89.9 88.4 87.7  PLT 270 238 220   Basic Metabolic Panel: Recent Labs  Lab 09/17/20 0300 09/17/20 0645 09/18/20 0345  NA 138  --  139  K 4.5  --  3.8  CL 103  --  107  CO2 23  --  24  GLUCOSE 90  --  93  BUN 13  --  14  CREATININE 1.30* 1.27* 1.17  CALCIUM 8.7*  --  8.4*  MG  --   --  1.8   Liver Function Tests: Recent Labs  Lab 09/17/20 0300  AST 27  ALT 15  ALKPHOS 83  BILITOT 1.8*  PROT 5.9*  ALBUMIN 2.8*   Coagulation Profile: No results for input(s): INR, PROTIME in the last 168 hours. HbA1C: Recent Labs    09/17/20 0645  HGBA1C 8.0*   CBG: No results for input(s): GLUCAP in the last 168 hours.  Recent Results (from the past 240 hour(s))  Resp Panel by RT-PCR (Flu A&B, Covid) Nasopharyngeal Swab     Status: None   Collection Time: 09/17/20  3:00 AM   Specimen: Nasopharyngeal Swab; Nasopharyngeal(NP) swabs in vial transport medium  Result Value Ref Range Status   SARS Coronavirus 2 by RT PCR NEGATIVE NEGATIVE Final    Comment: (NOTE) SARS-CoV-2 target nucleic acids are NOT DETECTED.  The SARS-CoV-2 RNA is generally detectable in upper respiratory specimens during the acute phase of infection. The lowest concentration of SARS-CoV-2 viral copies this assay can detect is 138 copies/mL. A negative result does not preclude SARS-Cov-2 infection and should not be used as the sole basis for treatment or other patient management decisions. A negative  result may occur with  improper specimen collection/handling, submission of specimen other than nasopharyngeal swab, presence of viral mutation(s) within the areas targeted by this assay, and inadequate number of viral copies(<138 copies/mL). A negative result must be combined with clinical observations, patient history, and epidemiological information. The expected result is Negative.  Fact Sheet for Patients:  09/19/20  Fact Sheet for Healthcare Providers:  BloggerCourse.com  This  test is no t yet approved or cleared by the [place] and  has been authorized for detection and/or diagnosis of SARS-CoV-2 by FDA under an Emergency Use Authorization (EUA). This EUA will remain  in effect (meaning this test can be used) for the duration of the COVID-19 declaration under Section 564(b)(1) of the Act, 21 U.S.C.section 360bbb-3(b)(1), unless the authorization is terminated  or revoked sooner.       Influenza A by PCR NEGATIVE NEGATIVE Final   Influenza B by PCR NEGATIVE NEGATIVE Final    Comment: (NOTE) The Xpert Xpress SARS-CoV-2/FLU/RSV plus assay is intended as an aid in the diagnosis of influenza from Nasopharyngeal swab specimens and should not be used as a sole basis for treatment. Nasal washings and aspirates are unacceptable for Xpert Xpress SARS-CoV-2/FLU/RSV testing.  Fact Sheet for Patients: BloggerCourse.com  Fact Sheet for Healthcare Providers: SeriousBroker.it  This test is not yet approved or cleared by the [place] FDA and has been authorized for detection and/or diagnosis of SARS-CoV-2 by FDA under an Emergency Use Authorization (EUA). This EUA will remain in effect (meaning this test can be used) for the duration of the COVID-19 declaration under Section 564(b)(1) of the Act, 21 U.S.C. section 360bbb-3(b)(1), unless the authorization is  terminated or revoked.  Performed at [facility], [address] [place], [place] [address]      Radiology Studies: ECHOCARDIOGRAM COMPLETE  Result Date: 09/17/2020    ECHOCARDIOGRAM REPORT   Patient Name:   Randall [name] Date of Exam: 09/17/2020 Medical Rec #:  [id number]   Height:       70.0 in Accession #:    [id number]  Weight:       215.0 lb Date of Birth:  November 27, 1945   BSA:          2.152 m Patient Age:    75 years    BP:           143/55 mmHg Patient Gender: M           HR:           71 bpm. Exam Location:  Inpatient Procedure: 2D Echo, Cardiac Doppler and Color Doppler Indications:    CVA  History:        Patient has no prior history of Echocardiogram examinations.  Sonographer:    Dell Ponto RDCS Referring Phys: 27 Eduard Clos  Sonographer Comments: Suboptimal parasternal window, suboptimal apical window, suboptimal subcostal window, patient is morbidly obese and Technically challenging study due to limited acoustic windows. Image acquisition challenging due to patient body habitus. IMPRESSIONS  1. Left ventricular ejection fraction, by estimation, is 55 to 60%. The left ventricle has normal function. The left ventricle demonstrates regional wall motion abnormalities (see scoring diagram/findings for description). There is mild left ventricular  hypertrophy of the basal-septal segment. Left ventricular diastolic parameters are consistent with Grade I diastolic dysfunction (impaired relaxation). Elevated left atrial pressure. The E/e' is E/e'=18.3.  2. Right ventricular systolic function is normal. The right ventricular size is normal.  3. Left atrial size was moderately dilated.  4. The mitral valve is normal in structure. No evidence of mitral valve regurgitation. No evidence of mitral stenosis. Moderate mitral annular calcification.  5. The aortic valve is tricuspid. There is mild calcification of the aortic valve. There is moderate thickening of the aortic valve. Aortic valve  regurgitation is not visualized. Mild to moderate aortic valve sclerosis/calcification is present, without any evidence of aortic stenosis.  FINDINGS  Left Ventricle: Left ventricular ejection fraction, by estimation, is 55 to 60%. The left ventricle has normal function. The left ventricle demonstrates regional wall motion abnormalities. The left ventricular internal cavity size was normal in size. There is mild left ventricular hypertrophy of the basal-septal segment. Left ventricular diastolic parameters are consistent with Grade I diastolic dysfunction (impaired relaxation). Elevated left atrial pressure. The E/e' is E/e'=18.3.  LV Wall Scoring: The basal inferolateral segment and basal inferior segment are hypokinetic. Right Ventricle: The right ventricular size is normal. No increase in right ventricular wall thickness. Right ventricular systolic function is normal. Left Atrium: Left atrial size was moderately dilated. Right Atrium: Right atrial size was normal in size. Pericardium: There is no evidence of pericardial effusion. Mitral Valve: The mitral valve is normal in structure. Moderate mitral annular calcification. No evidence of mitral valve regurgitation. No evidence of mitral valve stenosis. MV peak gradient, 8.8 mmHg. The mean mitral valve gradient is 3.0 mmHg. Tricuspid Valve: The tricuspid valve is normal in structure. Tricuspid valve regurgitation is not demonstrated. Aortic Valve: The aortic valve is tricuspid. There is mild calcification of the aortic valve. There is moderate thickening of the aortic valve. Aortic valve regurgitation is not visualized. Mild to moderate aortic valve sclerosis/calcification is present, without any evidence of aortic stenosis. Aortic valve mean gradient measures 3.0 mmHg. Aortic valve peak gradient measures 7.1 mmHg. Aortic valve area, by VTI measures 3.46 cm. Pulmonic Valve: The pulmonic valve was grossly normal. Pulmonic valve regurgitation is not visualized.  Aorta: The aortic root and ascending aorta are structurally normal, with no evidence of dilitation. IAS/Shunts: No atrial level shunt detected by color flow Doppler.  LEFT VENTRICLE PLAX 2D LVIDd:         5.10 cm     Diastology LVIDs:         3.55 cm     LV e' medial:    4.68 cm/s LV PW:         1.15 cm     LV E/e' medial:  19.9 LV IVS:        1.50 cm     LV e' lateral:   5.55 cm/s LVOT diam:     2.02 cm     LV E/e' lateral: 16.8 LV SV:         82 LV SV Index:   38 LVOT Area:     3.20 cm  LV Volumes (MOD) LV vol d, MOD A2C: 71.4 ml LV vol d, MOD A4C: 74.7 ml LV vol s, MOD A2C: 33.3 ml LV vol s, MOD A4C: 21.2 ml LV SV MOD A2C:     38.1 ml LV SV MOD A4C:     74.7 ml LV SV MOD BP:      46.9 ml RIGHT VENTRICLE RV Basal diam:  3.10 cm RV Mid diam:    2.10 cm RV S prime:     16.50 cm/s TAPSE (M-mode): 2.3 cm LEFT ATRIUM             Index       RIGHT ATRIUM           Index LA diam:        3.40 cm 1.58 cm/m  RA Area:     13.60 cm LA Vol (A2C):   76.5 ml 35.54 ml/m RA Volume:   33.50 ml  15.56 ml/m LA Vol (A4C):   66.7 ml 30.99 ml/m LA Biplane Vol: 73.1 ml 33.96 ml/m  AORTIC VALVE  PULMONIC VALVE AV Area (Vmax):    2.87 cm    PV Vmax:       1.42 m/s AV Area (Vmean):   3.12 cm    PV Vmean:      96.000 cm/s AV Area (VTI):     3.46 cm    PV VTI:        0.306 m AV Vmax:           133.00 cm/s PV Peak grad:  8.1 mmHg AV Vmean:          81.400 cm/s PV Mean grad:  4.0 mmHg AV VTI:            0.236 m AV Peak Grad:      7.1 mmHg AV Mean Grad:      3.0 mmHg LVOT Vmax:         119.00 cm/s LVOT Vmean:        79.300 cm/s LVOT VTI:          0.255 m LVOT/AV VTI ratio: 1.08  AORTA Ao Root diam: 3.20 cm Ao Asc diam:  3.50 cm MITRAL VALVE MV Area (PHT): 2.76 cm     SHUNTS MV Peak grad:  8.8 mmHg     Systemic VTI:  0.26 m MV Mean grad:  3.0 mmHg     Systemic Diam: 2.02 cm MV Vmax:       1.48 m/s MV Vmean:      72.1 cm/s MV Decel Time: 275 msec MV E velocity: 93.00 cm/s MV A velocity: 146.00 cm/s MV E/A ratio:  0.64  Mihai Croitoru MD Electronically signed by Thurmon FairMihai Croitoru MD Signature Date/Time: 09/17/2020/1:19:37 PM    Final      Pamella Pertostin Montee Tallman, MD, PhD [facility]  Between 7 am - 7 pm I am available, please contact me via Amion (for emergencies) or Securechat (non urgent messages)  Between 7 pm - 7 am I am not available, please contact night coverage MD/APP via Amion

## 2020-09-18 NOTE — Progress Notes (Addendum)
STROKE TEAM PROGRESS NOTE    Interval History  Patient is resting in bed.  He appears drowsy and intermittently closes eyes but can be aroused follow commands.  He was confused last night and did not sleep well and had to be sedated No acute events overnight,   no family at bedside. He continues to state that he is tired and is reluctant to participate in the neurological examination.    MRI brain this morning shows late subacute left cerebellar and left dorsal medullary infarct as well as left PCA subacute infarct adjacent to an old 1.  No hemorrhagic transformation.  .  Echocardiogram showed normal ejection fraction.  Pertinent Lab Work and Imaging    09/17/20 CT Head WO IV Contrast 1. No acute intracranial hemorrhage. 2. Age indeterminate left cerebellar infarct, possibly subacute. Clinical correlation is recommended. Old left occipital infarct. 3. No acute/traumatic cervical spine pathology.  CT Angio Head and Neck W WO IV Contrast No occlusion in the neck.  Mild plaque at the ICA origins.   Significant intracranial atherosclerosis. 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 There is focal high-grade stenosis of the left A2 ACA, right M1 MCA, and left P2 and P3 PCA. Additional distal ACA and MCA branch stenoses bilaterally.  MRI Brain WO IV Contrast Late subacute left cerebellar, left dorsal medullary and left peripheral posterior cerebral artery infarcts.  No hemorrhagic transformation.  Echocardiogram Complete  Ejection fraction 55 to 60%.  No cardiac source of embolism  Physical Examination   Constitutional: Calm, appropriate for condition .  He has left parietal swelling and abrasion on his lips and face Cardiovascular: Normal RR Respiratory: No increased WOB   Mental status: Patient is sleepy and keeps eyes closed but can be aroused to follow commands oriented to time place and person. Speech: Fluent. Repetition and naming not assessed  Cranial  nerves: EOMI, Right dense visual field cut, Left facial droop, Tongue midline  Motor: Normal bulk and tone. No drift. He is antigravity throughout.  Diminished fine finger movements on the left but doubt cooperation. Sensory: Intact to light touch throughout  Coordination: Diminished fine finger movements to the left Gait: Deferred  Assessment and Plan   Randall [name] is a 75 y.o. 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 He was outside of the window for IVTPA and thrombectomy.  #L Cerebellar hypodensity  ? subacute infarct versus contusion related to fall   #Prior L Occipital Stroke  Patient presented with the symptoms described above. At this time, stroke work up is ongoing. 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 Vessel imaging was obtained with CTA Head and Neck, this showed significant intracranial atherosclerosis; moderate to marked stenosis of the distal right vertebral artery. High grade stenosis of the left P2 and P3 PCA. Stroke labs w/LDL 59, hemoglobin a1c 8.0. Waiting on MRI Brain and Echo. Stroke etiology is cryptogenic, atheroembolism considering posterior circulation stenosis versus cardioembolic  - DAPT 3 months followed by Plavix monotherapy  - LDL at goal, no need for statin therapy  - At discharge please place ambulatory referral to neurology for stroke follow up   #Hypertension No documented history of hypertension however pressures are trending 140-170. Unclear last known well, no need for permissive hypertension at this time. Recommend to target normotension avoiding acute blood pressure drops.   #Hyperlipidemia From a stroke  prevention stand point, the LDL goal is < 70. LDL is at goal at 59, no need for statins   #Stroke Diabetes Screening Hemoglobin A1C this admission noted to be 8.0, he is diabetic. Recommend SSI management while inpatient and  follow up with PCP at discharge    Hospital day # 0   Patient fell out of bed and has a facial abrasion and CT scan shows left cerebellar hypodensity unclear as to whether this represents subacute infarct or contusion due to his fall.  Recommend check MRI scan of the brain for further clarification on this issue.  Okay to resume anticoagulation for his atrial fibrillation for secondary stroke prevention.  Discontinue aspirin..  Discussed with Dr. Stevphen Meuse.  Greater than 50% time during this 25-minute visit was spent on counseling and coordination of care about his stroke and answering questions.  Stroke team will sign off.  Kindly call for questions.  Delia Heady, MD Medical Director [facility] Pager: [phone] 09/18/2020 12:43 PM  To contact Stroke Continuity provider, please refer to [email or web address]. After hours, contact General Neurology

## 2020-09-18 NOTE — Care Management Obs Status (Signed)
MEDICARE OBSERVATION STATUS NOTIFICATION   Patient Details  Name: Randall [name] MRN: [id number] Date of Birth: [birth year]   Medicare Observation Status Notification Given:  Yes    Baldemar Lenis, LCSW 09/18/2020, 1:46 PM

## 2020-09-18 NOTE — Progress Notes (Signed)
ANTICOAGULATION CONSULT NOTE - Initial Consult  Pharmacy Consult for Apixaban Indication: atrial fibrillation  No Known Allergies  Patient Measurements: Height: 5\' 10"  (177.8 cm) Weight: 97.5 kg (215 lb) IBW/kg (Calculated) : 73  Vital Signs: Temp: 98.4 F (36.9 C) (08/31 1224) Temp Source: Axillary (08/31 1224) BP: 124/57 (08/31 1224) Pulse Rate: 54 (08/31 1224)  Labs: Recent Labs    09/17/20 0300 09/17/20 0645 09/18/20 0345  HGB 12.6* 11.9* 11.7*  HCT 40.0 36.7* 36.2*  PLT 270 238 220  CREATININE 1.30* 1.27* 1.17    Estimated Creatinine Clearance: 61.9 mL/min (by C-G formula based on SCr of 1.17 mg/dL).   Medical History: History reviewed. No pertinent past medical history.  Medications:  Scheduled:   apixaban  5 mg Oral BID   Tdap  0.5 mL Intramuscular Once    Assessment: 13 YOM who presented on 8/30 s/p fall. The patient was on Apixaban PTA for hx Afib/prior CVA with concern for new CVA this admission. Needs MRI for further eval, no bleeding seen on head CT. Pharmacy consulted to resume Apixaban for anticoagulation.   Goal of Therapy:  Appropriate anticoagulation for indication and hepatic/renal function   Plan:  - Resume Apixaban 5 mg bid - Pharmacy will sign off of dosing consult but continue to monitor peripherally for any necessary dose adjustments or signs/symptoms of bleeding.   Thank you for allowing pharmacy to be a part of this patient's care.  9/30, PharmD, BCPS Clinical Pharmacist Clinical phone for 09/18/2020: [phone] 09/18/2020 1:04 PM   **Pharmacist phone directory can now be found on [email or web address] ([email or web address]).  Listed under [facility].

## 2020-09-18 NOTE — Progress Notes (Signed)
Physical Therapy Treatment Patient Details Name: Randall [name] MRN: [id number] DOB: [birth year] Today's Date: 09/18/2020    History of Present Illness Pt is a 75yo male from [facility] fell out of bed and hit head on recliner. 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 Labs reveal acute renal failure and anemia. PMH: Afib (on Apixaban).    PT Comments    Pt received in sidelying to left side, drowsy and c/o nausea, agreeable to limited therapy session and deferring OOB mobility. Pt with lunch tray in room untouched and reports he normally likes steak/spaghetti but today unable to eat anything. Pt agreeable to rolling/repositioning in bed so therapist can remove wet bed pad but unwilling to sit EOB and transfer to chair. Pt able to bridge hips up minimally in supine and rolling with Supervision/cues. RN notified pt with skin breakdown on buttocks. Pt continues to benefit from PT services to progress toward functional mobility goals. Plan to assess seated/standing balance next date if pt more alert.   Follow Up Recommendations  SNF (unless ALF able to provide necessary assist)     Equipment Recommendations  Wheelchair (measurements PT);Wheelchair cushion (measurements PT)    Recommendations for Other Services       Precautions / Restrictions Precautions Precautions: Fall Precaution Comments: R hemianopsia from prior CVA Restrictions Weight Bearing Restrictions: No    Mobility  Bed Mobility Overal bed mobility: Needs Assistance Bed Mobility: Rolling Rolling: Supervision         General bed mobility comments: pt rolled to L/R x2 reps ea for repositioning and replacement of wet bed pad, pt defers transition to EOB despite multiple attempts, pt able to use bed rails to assist with repositioning.    Transfers                 General transfer comment: declined OOB  Ambulation/Gait                  Stairs             Wheelchair Mobility    Modified Rankin (Stroke Patients Only) Modified Rankin (Stroke Patients Only) Pre-Morbid Rankin Score: Moderately severe disability Modified Rankin: Moderately severe disability     Balance                                            Cognition Arousal/Alertness: Awake/alert;Lethargic Behavior During Therapy: Flat affect Overall Cognitive Status: No family/caregiver present to determine baseline cognitive functioning                                 General Comments: Pt has history of CVA but denies any changes in cognition. Pt generally tired and expressed desire to sleep. Pt self-limiting and discussed benefits of mobility and noted skin breakdown on posterior buttocks (RN also notified) but not agreeable to attempt chair transfer.         General Comments General comments (skin integrity, edema, etc.): noted skin breakdown on pt buttocks L side worse than R, RN notified he may need foam bandage      Pertinent Vitals/Pain Pain Assessment: No/denies pain     PT Goals (current goals can now be found in the care plan section) Acute Rehab PT Goals Patient  Stated Goal: return to his nap PT Goal Formulation: With patient Time For Goal Achievement: 10/01/20 Progress towards PT goals: Not progressing toward goals - comment (very limited participation)    Frequency    Min 3X/week      PT Plan Current plan remains appropriate       AM-PAC PT "6 Clicks" Mobility   Outcome Measure  Help needed turning from your back to your side while in a flat bed without using bedrails?: A Little Help needed moving from lying on your back to sitting on the side of a flat bed without using bedrails?: A Lot Help needed moving to and from a bed to a chair (including a wheelchair)?: Total Help needed standing up from a chair using your arms (e.g., wheelchair or bedside chair)?:  Total Help needed to walk in hospital room?: Total Help needed climbing 3-5 steps with a railing? : Total 6 Click Score: 9    End of Session   Activity Tolerance: Patient limited by fatigue Patient left: in bed;with call bell/phone within reach;with bed alarm set;Other (comment) (pillow between legs for neutral hip posture and to prevent chafing at knees) Nurse Communication: Mobility status PT Visit Diagnosis: Other symptoms and signs involving the nervous system (R29.898);Muscle weakness (generalized) (M62.81);Other abnormalities of gait and mobility (R26.89);History of falling (Z91.81)     Time: 0263-7858 PT Time Calculation (min) (ACUTE ONLY): 14 min  Charges:  $Therapeutic Activity: 8-22 mins                     Malakai Schoenherr P., PTA Acute Rehabilitation Services Pager: [phone] Office: [phone]    [place] 09/18/2020, 6:25 PM

## 2020-09-18 NOTE — Discharge Instructions (Addendum)

## 2020-09-19 DIAGNOSIS — E785 Hyperlipidemia, unspecified: Secondary | ICD-10-CM | POA: Diagnosis present

## 2020-09-19 DIAGNOSIS — Z23 Encounter for immunization: Secondary | ICD-10-CM | POA: Diagnosis not present

## 2020-09-19 DIAGNOSIS — I672 Cerebral atherosclerosis: Secondary | ICD-10-CM | POA: Diagnosis present

## 2020-09-19 DIAGNOSIS — I10 Essential (primary) hypertension: Secondary | ICD-10-CM | POA: Diagnosis present

## 2020-09-19 DIAGNOSIS — W06XXXA Fall from bed, initial encounter: Secondary | ICD-10-CM | POA: Diagnosis present

## 2020-09-19 DIAGNOSIS — Z66 Do not resuscitate: Secondary | ICD-10-CM | POA: Diagnosis present

## 2020-09-19 DIAGNOSIS — E119 Type 2 diabetes mellitus without complications: Secondary | ICD-10-CM | POA: Diagnosis present

## 2020-09-19 DIAGNOSIS — I639 Cerebral infarction, unspecified: Secondary | ICD-10-CM | POA: Diagnosis present

## 2020-09-19 DIAGNOSIS — R29701 NIHSS score 1: Secondary | ICD-10-CM | POA: Diagnosis present

## 2020-09-19 DIAGNOSIS — I63542 Cerebral infarction due to unspecified occlusion or stenosis of left cerebellar artery: Secondary | ICD-10-CM | POA: Diagnosis present

## 2020-09-19 DIAGNOSIS — Z8673 Personal history of transient ischemic attack (TIA), and cerebral infarction without residual deficits: Secondary | ICD-10-CM | POA: Diagnosis not present

## 2020-09-19 DIAGNOSIS — I4510 Unspecified right bundle-branch block: Secondary | ICD-10-CM | POA: Diagnosis present

## 2020-09-19 DIAGNOSIS — N179 Acute kidney failure, unspecified: Secondary | ICD-10-CM | POA: Diagnosis present

## 2020-09-19 DIAGNOSIS — Z20822 Contact with and (suspected) exposure to covid-19: Secondary | ICD-10-CM | POA: Diagnosis present

## 2020-09-19 DIAGNOSIS — R29810 Facial weakness: Secondary | ICD-10-CM | POA: Diagnosis present

## 2020-09-19 DIAGNOSIS — Z794 Long term (current) use of insulin: Secondary | ICD-10-CM | POA: Diagnosis not present

## 2020-09-19 DIAGNOSIS — S0101XA Laceration without foreign body of scalp, initial encounter: Secondary | ICD-10-CM | POA: Diagnosis present

## 2020-09-19 DIAGNOSIS — F0390 Unspecified dementia without behavioral disturbance: Secondary | ICD-10-CM | POA: Diagnosis present

## 2020-09-19 DIAGNOSIS — D649 Anemia, unspecified: Secondary | ICD-10-CM | POA: Diagnosis present

## 2020-09-19 MED ORDER — DOCUSATE SODIUM 100 MG PO CAPS: 100.0000 mg | ORAL_CAPSULE | Freq: Two times a day (BID) | ORAL | 1 refills | Status: DC

## 2020-09-19 MED ORDER — PANCRELIPASE (LIP-PROT-AMYL) 12000-38000 UNITS PO CPEP: 12000.0000 [IU] | ORAL_CAPSULE | Freq: Four times a day (QID) | ORAL | 1 refills | Status: DC

## 2020-09-19 MED ORDER — ALUM & MAG HYDROXIDE-SIMETH 200-200-20 MG/5ML PO SUSP: 30.0000 mL | ORAL | Status: DC | PRN

## 2020-09-19 MED ORDER — HYDRALAZINE HCL 50 MG PO TABS: 100.0000 mg | ORAL_TABLET | Freq: Three times a day (TID) | ORAL | 1 refills | Status: DC

## 2020-09-19 MED ORDER — "INSULIN SYRINGE 30G X 1/2"" 0.5 ML MISC": 1.0000 | Freq: Every day | 0 refills | Status: DC

## 2020-09-19 MED ORDER — ATORVASTATIN CALCIUM 40 MG PO TABS: 80.0000 mg | ORAL_TABLET | Freq: Every day | ORAL | 1 refills | Status: DC

## 2020-09-19 MED ORDER — CARVEDILOL 6.25 MG PO TABS: 6.2500 mg | ORAL_TABLET | Freq: Two times a day (BID) | ORAL | 1 refills | Status: DC

## 2020-09-19 MED ORDER — MECLIZINE HCL 25 MG PO TABS: 25.0000 mg | ORAL_TABLET | Freq: Three times a day (TID) | ORAL | 1 refills | Status: DC | PRN

## 2020-09-19 MED ORDER — CYANOCOBALAMIN 1000 MCG/ML IJ SOLN
1000.0000 ug | Freq: Once | INTRAMUSCULAR | Status: DC
  Filled 2020-09-19: qty 1

## 2020-09-19 MED ORDER — ONDANSETRON HCL 4 MG PO TABS: 4.0000 mg | ORAL_TABLET | Freq: Three times a day (TID) | ORAL | 1 refills | Status: DC | PRN

## 2020-09-19 MED ORDER — POLYETHYLENE GLYCOL 3350 17 GM/SCOOP PO POWD: 17.0000 g | Freq: Every day | ORAL | 0 refills | Status: DC | PRN

## 2020-09-19 MED ORDER — VITAMIN B-12 1000 MCG PO TABS: 1000.0000 ug | ORAL_TABLET | Freq: Every day | ORAL | 1 refills | Status: DC

## 2020-09-19 MED ORDER — BLOOD GLUCOSE METER KIT
PACK | 0 refills | Status: DC
Start: 2020-09-19 — End: 2020-11-15

## 2020-09-19 MED ORDER — PANTOPRAZOLE SODIUM 40 MG PO TBEC: 40.0000 mg | DELAYED_RELEASE_TABLET | Freq: Two times a day (BID) | ORAL | 1 refills | Status: DC

## 2020-09-19 MED ORDER — ONDANSETRON HCL 4 MG/2ML IJ SOLN
4.0000 mg | Freq: Four times a day (QID) | INTRAMUSCULAR | Status: DC | PRN
  Administered 2020-09-19: 4 mg via INTRAVENOUS
  Filled 2020-09-19: qty 2

## 2020-09-19 MED ORDER — GLIPIZIDE 5 MG PO TABS: 5.0000 mg | ORAL_TABLET | Freq: Two times a day (BID) | ORAL | 1 refills | Status: DC

## 2020-09-19 MED ORDER — DICYCLOMINE HCL 10 MG PO CAPS: 10.0000 mg | ORAL_CAPSULE | Freq: Three times a day (TID) | ORAL | 1 refills | Status: DC

## 2020-09-19 MED ORDER — INSULIN GLARGINE (1 UNIT DIAL) 300 UNIT/ML ~~LOC~~ SOPN: 5.0000 [IU] | PEN_INJECTOR | Freq: Every day | SUBCUTANEOUS | 1 refills | Status: DC

## 2020-09-19 MED ORDER — APIXABAN 5 MG PO TABS: 5.0000 mg | ORAL_TABLET | Freq: Two times a day (BID) | ORAL | 1 refills | Status: DC

## 2020-09-19 MED ORDER — VALSARTAN 320 MG PO TABS
320.0000 mg | ORAL_TABLET | Freq: Every day | ORAL | 1 refills | Status: DC
Start: 2020-09-19 — End: 2020-11-15

## 2020-09-19 NOTE — Progress Notes (Signed)
Called and gave report to Volin, all questions and concerns answered. Pt is being transported by daughter-in-law in private vehicle.

## 2020-09-19 NOTE — TOC Transition Note (Signed)
Transition of Care [facility]) - CM/SW Discharge Note   Patient Details  Name: Randall [name] MRN: [id number] Date of Birth: 1945/08/06  [facility] CM/SW Contact:  Baldemar Lenis, LCSW Phone Number: 09/19/2020, 2:08 PM   Clinical Narrative:   CSW contacted [employer] about hospital bed, but unsure about coverage for bed due to patient's diagnoses. CSW obtained information on private pay rental just in case insurance does not approve, and CSW provided daughter with information. Daughter would like to wait on hospital bed at this time. CSW sent discharge information over to [facility] and confirmed that it was received. Daughter to provide transport.   Nurse to call report to [phone].    Final next level of care: Assisted Living Barriers to Discharge: Barriers Resolved   Patient Goals and CMS Choice Patient states their goals for this hospitalization and ongoing recovery are:: patient unable to participate in goal setting, only oriented to self CMS Medicare.gov Compare Post Acute Care list provided to:: Patient Represenative (must comment) Choice offered to / list presented to : Adult Children  Discharge Placement              Patient chooses bed at: [facility] Patient to be transferred to facility by: Family Name of family member notified: Mardella Layman Patient and family notified of of transfer: 09/19/20  Discharge Plan and Services                                     Social Determinants of Health (SDOH) Interventions     Readmission Risk Interventions No flowsheet data found.

## 2020-09-19 NOTE — Discharge Summary (Signed)
Physician Discharge Summary  Randall [name] ENM:[id number] DOB: 07-Jul-1945 DOA: 09/17/2020  PCP: Pcp, No  Admit date: 09/17/2020 Discharge date: 09/19/2020  Admitted From: ALF Disposition:  ALF  Recommendations for Outpatient Follow-up:  Follow up with PCP in 1-2 weeks Follow-up with cardiology in 1 to 2 weeks  Home Health: none Equipment/Devices: none  Discharge Condition: stable CODE STATUS: DNR Diet recommendation: heart healthy  HPI: Per admitting MD, Randall [name] is a 75 y.o. male with unknown past medical history was brought to the ER after patient slid out of the bed and he fell onto the floor hitting his face.  Patient is a poor historian and does not remember or recall his past medical history.  Patient is stating that he fell from the bed but did not lose consciousness.  Denies any pain.  Is able to move all extremities.  Hospital Course / Discharge diagnoses: Principal Problem Acute stroke -Neurology on board.  Spoke with Dr. Leonie Man.  CT head scan showed age-indeterminate left cerebellar infarct.  CT cervical spine showed  indeterminate left cerebellar infarct.  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  MRI of the brain was done and showed late subacute infarct in the left cerebellar hemisphere and small focus of infarct of the left dorsal aspect of the medulla.  It also showed subacute ischemia superimposed on remote PCA infarct.  Patient was recently admitted to [facility] with an infarct and there is a question whether this is pneumonia or whether subacute.  He underwent stroke work-up and a 2D echo showed EF 55 to 60%, normal LVEF.  A1c was 8.0.  LDL was 59.  Active Problems Status post fall with laceration in the face -Continue supportive care.    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 Essential hypertension-resume home  medications Type 2 diabetes mellitus-A1c 8.0.  Daughter tells me that at home, prior to his hospitalization at [facility] he was on Toujeo 30 units twice daily.  We will cut that down to 5 units daily and closely monitor CBGs as an outpatient.  She reports some hypoglycemic episodes at home.  Continue glipizide and metformin Anemia normocytic normochromic -Ferritin 99, iron at 37 and TIBC low at 185.  B12 borderline low, favor to replete   Sepsis ruled out   Discharge Instructions   Allergies as of 09/19/2020   No Known Allergies      Medication List     STOP taking these medications    clopidogrel 75 MG tablet Commonly known as: PLAVIX       TAKE these medications    acetaminophen 325 MG tablet Commonly known as: TYLENOL Take 650 mg by mouth every 6 (six) hours as needed (for pain).   apixaban 5 MG Tabs tablet Commonly known as: Eliquis Take 1 tablet (5 mg total) by mouth in the morning and at bedtime.   atorvastatin 40 MG tablet Commonly known as: LIPITOR Take 2 tablets (80 mg total) by mouth at bedtime.   blood glucose meter kit and supplies Dispense based on patient and insurance preference. Use up to four times daily as directed. (FOR ICD-10 E10.9, E11.9).   carvedilol 6.25 MG tablet Commonly known as: COREG Take 1 tablet (6.25 mg total) by mouth 2 (two) times daily with a meal.   dicyclomine 10 MG capsule Commonly known as: BENTYL Take  1 capsule (10 mg total) by mouth 4 (four) times daily -  before meals and at bedtime.   docusate sodium 100 MG capsule Commonly known as: COLACE Take 1 capsule (100 mg total) by mouth in the morning and at bedtime.   glipiZIDE 5 MG tablet Commonly known as: GLUCOTROL Take 1 tablet (5 mg total) by mouth 2 (two) times daily before a meal.   hydrALAZINE 50 MG tablet Commonly known as: APRESOLINE Take 2 tablets (100 mg total) by mouth 3 (three) times daily.   insulin glargine (1 Unit Dial) 300 UNIT/ML Solostar Pen Commonly  known as: TOUJEO Inject 5 Units into the skin daily.   INSULIN SYRINGE .5CC/30GX1/2" 30G X 1/2" 0.5 ML Misc 1 each by Does not apply route daily.   lipase/protease/amylase 12000-38000 units Cpep capsule Commonly known as: CREON Take 1 capsule (12,000 Units total) by mouth 4 (four) times daily.   meclizine 25 MG tablet Commonly known as: ANTIVERT Take 1 tablet (25 mg total) by mouth every 8 (eight) hours as needed for dizziness.   ondansetron 4 MG tablet Commonly known as: ZOFRAN Take 1 tablet (4 mg total) by mouth every 8 (eight) hours as needed for nausea or vomiting.   pantoprazole 40 MG tablet Commonly known as: PROTONIX Take 1 tablet (40 mg total) by mouth 2 (two) times daily before a meal.   polyethylene glycol powder 17 GM/SCOOP powder Commonly known as: GLYCOLAX/MIRALAX Take 17 g by mouth daily as needed for moderate constipation. What changed:  when to take this reasons to take this   valsartan 320 MG tablet Commonly known as: DIOVAN Take 1 tablet (320 mg total) by mouth daily.   vitamin B-12 1000 MCG tablet Commonly known as: CYANOCOBALAMIN Take 1 tablet (1,000 mcg total) by mouth daily.        Follow-up Information     [facility] Follow up in 2 week(s).   Specialty: Cardiology Contact information: [address], [place] [phone]                Consultations: Neurology   Procedures/Studies  CT ANGIO HEAD NECK W WO CM  Result Date: 09/17/2020 CLINICAL DATA:  Stroke, evaluate circulation EXAM: CT ANGIOGRAPHY HEAD AND NECK TECHNIQUE: Multidetector CT imaging of the head and neck was performed using the standard protocol during bolus administration of intravenous contrast. Multiplanar CT image reconstructions and MIPs were obtained to evaluate the vascular anatomy. Carotid stenosis measurements (when applicable) are obtained utilizing NASCET criteria, using the distal internal  carotid diameter as the denominator. CONTRAST:  33m OMNIPAQUE IOHEXOL 350 MG/ML SOLN COMPARISON:  Correlation made with CT head earlier same day FINDINGS: CTA NECK Aortic arch: Mixed plaque along the arch and at the patent great vessel origins. There is no high-grade proximal subclavian artery stenosis. Right carotid system: Patent. Mild noncalcified plaque along the common carotid. Mixed plaque along the proximal internal carotid with minimal stenosis. Left carotid system: Patent. Minimal noncalcified plaque along the common carotid. Mild calcified plaque along the proximal internal carotid with minimal stenosis. Partially retropharyngeal course. Vertebral arteries: Patent. Left vertebral artery is dominant. No stenosis. Skeleton: Cervical spine degenerative changes. Other neck: Subcentimeter left thyroid nodule. No ultrasound follow up is recommended by current guidelines. Upper chest: No apical lung mass. Review of the MIP images confirms the above findings CTA HEAD Anterior circulation: The intracranial internal carotid arteries are patent with calcified plaque but no significant stenosis. Anterior cerebral arteries  are patent. Anterior communicating artery is present. There is focal high-grade stenosis of the left A2 ACA. Middle cerebral arteries are patent. There is high-grade stenosis of the right M1 MCA. There are distal ACA and MCA stenoses bilaterally. Posterior circulation: Intracranial vertebral arteries are patent. Noncalcified plaque along the distal right vertebral causes moderate to marked stenosis. Basilar artery is patent. Major cerebellar artery origins are patent. Bilateral posterior communicating arteries are present. There is fetal origin of the left PCA. Posterior cerebral arteries are patent. There are high-grade stenoses of the left P2 and P3 PCA. Venous sinuses: Patent as allowed by contrast bolus timing. Review of the MIP images confirms the above findings IMPRESSION: No occlusion in the  neck.  Mild plaque at the ICA origins. Significant intracranial atherosclerosis. 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 There is focal high-grade stenosis of the left A2 ACA, right M1 MCA, and left P2 and P3 PCA. Additional distal ACA and MCA branch stenoses bilaterally. Electronically Signed   By: Macy Mis M.D.   On: 09/17/2020 10:14   CT HEAD WO CONTRAST (5MM)  Result Date: 09/17/2020 CLINICAL DATA:  Trauma. EXAM: CT HEAD WITHOUT CONTRAST CT CERVICAL SPINE WITHOUT CONTRAST TECHNIQUE: Multidetector CT imaging of the head and cervical spine was performed following the standard protocol without intravenous contrast. Multiplanar CT image reconstructions of the cervical spine were also generated. COMPARISON:  None. FINDINGS: CT HEAD FINDINGS Brain: Moderate age-related atrophy and chronic microvascular ischemic changes. 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 An area of infarct involving the left cerebellum, age indeterminate, possibly subacute. Acute infarct is not excluded clinical correlation is recommended. MRI may provide better evaluation if there is high clinical concern for acute infarct. There is no acute intracranial hemorrhage. No mass effect or midline shift. No extra-axial fluid collection. Vascular: No hyperdense vessel or unexpected calcification. Skull: Normal. Negative for fracture or focal lesion. Sinuses/Orbits: No acute finding. Other: None CT CERVICAL SPINE FINDINGS Alignment: No acute subluxation. Skull base and vertebrae: Acute fracture.  Osteopenia. Soft tissues and spinal canal: No prevertebral fluid or swelling. No visible canal hematoma. Disc levels:  Degenerative changes primarily at C5-C6. Upper chest: Negative. Other: Bilateral carotid bulb calcified plaques. IMPRESSION: 1. No acute intracranial hemorrhage. 2. Age indeterminate left cerebellar infarct, possibly subacute. Clinical correlation is recommended. Old left occipital infarct. 3. No  acute/traumatic cervical spine pathology. These results were called by telephone at the time of interpretation on 09/17/2020 at 1:58 am to provider [facility] , who verbally acknowledged these results. Electronically Signed   By: Anner Crete M.D.   On: 09/17/2020 02:09   CT Cervical Spine Wo Contrast  Result Date: 09/17/2020 CLINICAL DATA:  Trauma. EXAM: CT HEAD WITHOUT CONTRAST CT CERVICAL SPINE WITHOUT CONTRAST TECHNIQUE: Multidetector CT imaging of the head and cervical spine was performed following the standard protocol without intravenous contrast. Multiplanar CT image reconstructions of the cervical spine were also generated. COMPARISON:  None. FINDINGS: CT HEAD FINDINGS Brain: Moderate age-related atrophy and chronic microvascular ischemic changes. 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 An area of infarct involving the left cerebellum, age indeterminate, possibly subacute. Acute infarct is not excluded clinical correlation is recommended. MRI may provide better evaluation if there is high clinical concern for acute infarct. There is no acute intracranial hemorrhage. No mass effect or midline shift. No extra-axial fluid collection. Vascular: No hyperdense vessel or unexpected calcification. Skull: Normal. Negative for fracture or focal lesion. Sinuses/Orbits: No acute finding. Other: None CT CERVICAL  SPINE FINDINGS Alignment: No acute subluxation. Skull base and vertebrae: Acute fracture.  Osteopenia. Soft tissues and spinal canal: No prevertebral fluid or swelling. No visible canal hematoma. Disc levels:  Degenerative changes primarily at C5-C6. Upper chest: Negative. Other: Bilateral carotid bulb calcified plaques. IMPRESSION: 1. No acute intracranial hemorrhage. 2. Age indeterminate left cerebellar infarct, possibly subacute. Clinical correlation is recommended. Old left occipital infarct. 3. No acute/traumatic cervical spine pathology. These results were called by telephone at the time  of interpretation on 09/17/2020 at 1:58 am to provider [facility] , who verbally acknowledged these results. Electronically Signed   By: Anner Crete M.D.   On: 09/17/2020 02:09   MR BRAIN WO CONTRAST  Result Date: 09/18/2020 CLINICAL DATA:  Facial trauma EXAM: MRI HEAD WITHOUT CONTRAST TECHNIQUE: Multiplanar, multiecho pulse sequences of the brain and surrounding structures were obtained without intravenous contrast. COMPARISON:  CT head obtained 1 day prior FINDINGS: Brain: There is patchy restricted diffusion in the left cerebellar hemisphere and along the left dorsal aspect of the medulla with associated FLAIR signal abnormality. There is no mass effect or effacement of the fourth ventricle. There is additional cortically based diffusion restriction in the left PCA territory within a region of chronic left PCA infarct with encephalomalacia, gliosis, and ex vacuo dilatation of the left lateral ventricle. Curvilinear T1 hyperintensity is also seen in the left PCA territory with associated foci of SWI signal dropout suggesting cortical laminar necrosis. There is moderate to severe global parenchymal volume loss with enlargement of the ventricular system. Confluent FLAIR signal abnormality throughout the remainder of the subcortical and periventricular white matter likely reflects sequela of chronic white matter microangiopathy. An additional remote infarct is seen in the right superior frontal gyrus. There is no evidence of acute intracranial hemorrhage. No mass lesion is identified. There is no midline shift. Vascular: Normal flow voids. Skull and upper cervical spine: Normal marrow signal. Sinuses/Orbits: The paranasal sinuses are clear. The globes and orbits are unremarkable. Other: None. IMPRESSION: 1. Late subacute infarct involving the left cerebellar hemisphere and small focus of infarct in the left dorsal aspect of the medulla. 2. Patchy diffusion restriction in the left PCA territory suggests  subacute ischemia superimposed on a remote PCA infarct. 3. Background of moderate to severe parenchymal volume loss and chronic white matter microangiopathy. Electronically Signed   By: Valetta Mole M.D.   On: 09/18/2020 12:28   DG Pelvis Portable  Result Date: 09/17/2020 CLINICAL DATA:  Trauma. EXAM: PORTABLE PELVIS 1-2 VIEWS COMPARISON:  None. FINDINGS: There is no acute fracture or dislocation the bones are osteopenic. Degenerative changes of the lower lumbar spine. The soft tissues are unremarkable. IMPRESSION: No acute findings. Electronically Signed   By: Anner Crete M.D.   On: 09/17/2020 01:21   DG Chest Portable 1 View  Result Date: 09/17/2020 CLINICAL DATA:  Trauma. EXAM: PORTABLE CHEST 1 VIEW COMPARISON:  None. FINDINGS: Mild cardiomegaly. No focal consolidation or pneumothorax. Trace right pleural effusion present. Atherosclerotic calcification of the aorta. No acute osseous pathology. IMPRESSION: 1. Mild cardiomegaly. 2. Possible trace right pleural effusion.  No consolidation. Electronically Signed   By: Anner Crete M.D.   On: 09/17/2020 01:20   ECHOCARDIOGRAM COMPLETE  Result Date: 09/17/2020    ECHOCARDIOGRAM REPORT   Patient Name:   FLORENCE ANTONELLI Date of Exam: 09/17/2020 Medical Rec #:  [id number]   Height:       70.0 in Accession #:    [id number]  Weight:  215.0 lb Date of Birth:  11-Nov-1945   BSA:          2.152 m Patient Age:    89 years    BP:           143/55 mmHg Patient Gender: M           HR:           71 bpm. Exam Location:  Inpatient Procedure: 2D Echo, Cardiac Doppler and Color Doppler Indications:    CVA  History:        Patient has no prior history of Echocardiogram examinations.  Sonographer:    Georganna Skeans RDCS Referring Phys: 15 Rise Patience  Sonographer Comments: Suboptimal parasternal window, suboptimal apical window, suboptimal subcostal window, patient is morbidly obese and Technically challenging study due to limited acoustic windows. Image  acquisition challenging due to patient body habitus. IMPRESSIONS  1. Left ventricular ejection fraction, by estimation, is 55 to 60%. The left ventricle has normal function. The left ventricle demonstrates regional wall motion abnormalities (see scoring diagram/findings for description). There is mild left ventricular  hypertrophy of the basal-septal segment. Left ventricular diastolic parameters are consistent with Grade I diastolic dysfunction (impaired relaxation). Elevated left atrial pressure. The E/e' is E/e'=18.3.  2. Right ventricular systolic function is normal. The right ventricular size is normal.  3. Left atrial size was moderately dilated.  4. The mitral valve is normal in structure. No evidence of mitral valve regurgitation. No evidence of mitral stenosis. Moderate mitral annular calcification.  5. The aortic valve is tricuspid. There is mild calcification of the aortic valve. There is moderate thickening of the aortic valve. Aortic valve regurgitation is not visualized. Mild to moderate aortic valve sclerosis/calcification is present, without any evidence of aortic stenosis. FINDINGS  Left Ventricle: Left ventricular ejection fraction, by estimation, is 55 to 60%. The left ventricle has normal function. The left ventricle demonstrates regional wall motion abnormalities. The left ventricular internal cavity size was normal in size. There is mild left ventricular hypertrophy of the basal-septal segment. Left ventricular diastolic parameters are consistent with Grade I diastolic dysfunction (impaired relaxation). Elevated left atrial pressure. The E/e' is E/e'=18.3.  LV Wall Scoring: The basal inferolateral segment and basal inferior segment are hypokinetic. Right Ventricle: The right ventricular size is normal. No increase in right ventricular wall thickness. Right ventricular systolic function is normal. Left Atrium: Left atrial size was moderately dilated. Right Atrium: Right atrial size was normal in  size. Pericardium: There is no evidence of pericardial effusion. Mitral Valve: The mitral valve is normal in structure. Moderate mitral annular calcification. No evidence of mitral valve regurgitation. No evidence of mitral valve stenosis. MV peak gradient, 8.8 mmHg. The mean mitral valve gradient is 3.0 mmHg. Tricuspid Valve: The tricuspid valve is normal in structure. Tricuspid valve regurgitation is not demonstrated. Aortic Valve: The aortic valve is tricuspid. There is mild calcification of the aortic valve. There is moderate thickening of the aortic valve. Aortic valve regurgitation is not visualized. Mild to moderate aortic valve sclerosis/calcification is present, without any evidence of aortic stenosis. Aortic valve mean gradient measures 3.0 mmHg. Aortic valve peak gradient measures 7.1 mmHg. Aortic valve area, by VTI measures 3.46 cm. Pulmonic Valve: The pulmonic valve was grossly normal. Pulmonic valve regurgitation is not visualized. Aorta: The aortic root and ascending aorta are structurally normal, with no evidence of dilitation. IAS/Shunts: No atrial level shunt detected by color flow Doppler.  LEFT VENTRICLE PLAX 2D LVIDd:  5.10 cm     Diastology LVIDs:         3.55 cm     LV e' medial:    4.68 cm/s LV PW:         1.15 cm     LV E/e' medial:  19.9 LV IVS:        1.50 cm     LV e' lateral:   5.55 cm/s LVOT diam:     2.02 cm     LV E/e' lateral: 16.8 LV SV:         82 LV SV Index:   38 LVOT Area:     3.20 cm  LV Volumes (MOD) LV vol d, MOD A2C: 71.4 ml LV vol d, MOD A4C: 74.7 ml LV vol s, MOD A2C: 33.3 ml LV vol s, MOD A4C: 21.2 ml LV SV MOD A2C:     38.1 ml LV SV MOD A4C:     74.7 ml LV SV MOD BP:      46.9 ml RIGHT VENTRICLE RV Basal diam:  3.10 cm RV Mid diam:    2.10 cm RV S prime:     16.50 cm/s TAPSE (M-mode): 2.3 cm LEFT ATRIUM             Index       RIGHT ATRIUM           Index LA diam:        3.40 cm 1.58 cm/m  RA Area:     13.60 cm LA Vol (A2C):   76.5 ml 35.54 ml/m RA Volume:    33.50 ml  15.56 ml/m LA Vol (A4C):   66.7 ml 30.99 ml/m LA Biplane Vol: 73.1 ml 33.96 ml/m  AORTIC VALVE                   PULMONIC VALVE AV Area (Vmax):    2.87 cm    PV Vmax:       1.42 m/s AV Area (Vmean):   3.12 cm    PV Vmean:      96.000 cm/s AV Area (VTI):     3.46 cm    PV VTI:        0.306 m AV Vmax:           133.00 cm/s PV Peak grad:  8.1 mmHg AV Vmean:          81.400 cm/s PV Mean grad:  4.0 mmHg AV VTI:            0.236 m AV Peak Grad:      7.1 mmHg AV Mean Grad:      3.0 mmHg LVOT Vmax:         119.00 cm/s LVOT Vmean:        79.300 cm/s LVOT VTI:          0.255 m LVOT/AV VTI ratio: 1.08  AORTA Ao Root diam: 3.20 cm Ao Asc diam:  3.50 cm MITRAL VALVE MV Area (PHT): 2.76 cm     SHUNTS MV Peak grad:  8.8 mmHg     Systemic VTI:  0.26 m MV Mean grad:  3.0 mmHg     Systemic Diam: 2.02 cm MV Vmax:       1.48 m/s MV Vmean:      72.1 cm/s MV Decel Time: 275 msec MV E velocity: 93.00 cm/s MV A velocity: 146.00 cm/s MV E/A ratio:  0.64 Mihai Croitoru MD Electronically signed by Sanda Klein MD Signature  Date/Time: 09/17/2020/1:19:37 PM    Final      Subjective: - no chest pain, shortness of breath, no abdominal pain, nausea or vomiting.   Discharge Exam: BP (!) 151/68 (BP Location: Right Arm)   Pulse (!) 57   Temp 99.4 F (37.4 C) (Oral)   Resp 19   Ht _0  (1.778 m)   Wt 97.5 kg   SpO2 96%   BMI 30.85 kg/m   General: Pt is alert, awake, not in acute distress Cardiovascular: RRR, S1/S2 +, no rubs, no gallops Respiratory: CTA bilaterally, no wheezing, no rhonchi Abdominal: Soft, NT, ND, bowel sounds + Extremities: no edema, no cyanosis    The results of significant diagnostics from this hospitalization (including imaging, microbiology, ancillary and laboratory) are listed below for reference.     Microbiology: Recent Results (from the past 240 hour(s))  Resp Panel by RT-PCR (Flu A&B, Covid) Nasopharyngeal Swab     Status: None   Collection Time: 09/17/20  3:00 AM    Specimen: Nasopharyngeal Swab; Nasopharyngeal(NP) swabs in vial transport medium  Result Value Ref Range Status   SARS Coronavirus 2 by RT PCR NEGATIVE NEGATIVE Final    Comment: (NOTE) SARS-CoV-2 target nucleic acids are NOT DETECTED.  The SARS-CoV-2 RNA is generally detectable in upper respiratory specimens during the acute phase of infection. The lowest concentration of SARS-CoV-2 viral copies this assay can detect is 138 copies/mL. A negative result does not preclude SARS-Cov-2 infection and should not be used as the sole basis for treatment or other patient management decisions. A negative result may occur with  improper specimen collection/handling, submission of specimen other than nasopharyngeal swab, presence of viral mutation(s) within the areas targeted by this assay, and inadequate number of viral copies(<138 copies/mL). A negative result must be combined with clinical observations, patient history, and epidemiological information. The expected result is Negative.  Fact Sheet for Patients:  EntrepreneurPulse.com.au  Fact Sheet for Healthcare Providers:  IncredibleEmployment.be  This test is no t yet approved or cleared by the [place] FDA and  has been authorized for detection and/or diagnosis of SARS-CoV-2 by FDA under an Emergency Use Authorization (EUA). This EUA will remain  in effect (meaning this test can be used) for the duration of the COVID-19 declaration under Section 564(b)(1) of the Act, 21 U.S.C.section 360bbb-3(b)(1), unless the authorization is terminated  or revoked sooner.       Influenza A by PCR NEGATIVE NEGATIVE Final   Influenza B by PCR NEGATIVE NEGATIVE Final    Comment: (NOTE) The Xpert Xpress SARS-CoV-2/FLU/RSV plus assay is intended as an aid in the diagnosis of influenza from Nasopharyngeal swab specimens and should not be used as a sole basis for treatment. Nasal washings and aspirates are  unacceptable for Xpert Xpress SARS-CoV-2/FLU/RSV testing.  Fact Sheet for Patients: EntrepreneurPulse.com.au  Fact Sheet for Healthcare Providers: IncredibleEmployment.be  This test is not yet approved or cleared by the [place] FDA and has been authorized for detection and/or diagnosis of SARS-CoV-2 by FDA under an Emergency Use Authorization (EUA). This EUA will remain in effect (meaning this test can be used) for the duration of the COVID-19 declaration under Section 564(b)(1) of the Act, 21 U.S.C. section 360bbb-3(b)(1), unless the authorization is terminated or revoked.  Performed at [facility], [place] [address], [place], [place] [address]      Labs: Basic Metabolic Panel: Recent Labs  Lab 09/17/20 0300 09/17/20 0645 09/18/20 0345  NA 138  --  139  K 4.5  --  3.8  CL 103  --  107  CO2 23  --  24  GLUCOSE 90  --  93  BUN 13  --  14  CREATININE 1.30* 1.27* 1.17  CALCIUM 8.7*  --  8.4*  MG  --   --  1.8   Liver Function Tests: Recent Labs  Lab 09/17/20 0300  AST 27  ALT 15  ALKPHOS 83  BILITOT 1.8*  PROT 5.9*  ALBUMIN 2.8*   CBC: Recent Labs  Lab 09/17/20 0300 09/17/20 0645 09/18/20 0345  WBC 6.9 6.6 5.5  NEUTROABS 5.3  --   --   HGB 12.6* 11.9* 11.7*  HCT 40.0 36.7* 36.2*  MCV 89.9 88.4 87.7  PLT 270 238 220   CBG: No results for input(s): GLUCAP in the last 168 hours. Hgb A1c Recent Labs    09/17/20 0645  HGBA1C 8.0*   Lipid Profile Recent Labs    09/17/20 0645  CHOL 104  HDL 28*  LDLCALC 59  TRIG 85  CHOLHDL 3.7   Thyroid function studies No results for input(s): TSH, T4TOTAL, T3FREE, THYROIDAB in the last 72 hours.  Invalid input(s): FREET3 Urinalysis    Component Value Date/Time   COLORURINE AMBER (A) 09/17/2020 0253   APPEARANCEUR HAZY (A) 09/17/2020 0253   LABSPEC 1.031 (H) 09/17/2020 0253   PHURINE 5.0 09/17/2020 0253   GLUCOSEU NEGATIVE 09/17/2020 0253   HGBUR SMALL  (A) 09/17/2020 0253   BILIRUBINUR NEGATIVE 09/17/2020 0253   KETONESUR 20 (A) 09/17/2020 0253   PROTEINUR 100 (A) 09/17/2020 0253   NITRITE POSITIVE (A) 09/17/2020 0253   LEUKOCYTESUR SMALL (A) 09/17/2020 0253    FURTHER DISCHARGE INSTRUCTIONS:   Get Medicines reviewed and adjusted: Please take all your medications with you for your next visit with your Primary MD   Laboratory/radiological data: Please request your Primary MD to go over all hospital tests and procedure/radiological results at the follow up, please ask your Primary MD to get all Hospital records sent to his/her office.   In some cases, they will be blood work, cultures and biopsy results pending at the time of your discharge. Please request that your primary care M.D. goes through all the records of your hospital data and follows up on these results.   Also Note the following: If you experience worsening of your admission symptoms, develop shortness of breath, life threatening emergency, suicidal or homicidal thoughts you must seek medical attention immediately by calling 911 or calling your MD immediately  if symptoms less severe.   You must read complete instructions/literature along with all the possible adverse reactions/side effects for all the Medicines you take and that have been prescribed to you. Take any new Medicines after you have completely understood and accpet all the possible adverse reactions/side effects.    Do not drive when taking Pain medications or sleeping medications (Benzodaizepines)   Do not take more than prescribed Pain, Sleep and Anxiety Medications. It is not advisable to combine anxiety,sleep and pain medications without talking with your primary care practitioner   Special Instructions: If you have smoked or chewed Tobacco  in the last 2 yrs please stop smoking, stop any regular Alcohol  and or any Recreational drug use.   Wear Seat belts while driving.   Please note: You were cared for  by a hospitalist during your hospital stay. Once you are discharged, your primary care physician will handle any further medical issues. Please note that NO REFILLS for  any discharge medications will be authorized once you are discharged, as it is imperative that you return to your primary care physician (or establish a relationship with a primary care physician if you do not have one) for your post hospital discharge needs so that they can reassess your need for medications and monitor your lab values.  Time coordinating discharge: 40 minutes  SIGNED:  Marzetta Board, MD, PhD 09/19/2020, 12:28 PM

## 2020-09-19 NOTE — Progress Notes (Signed)
Occupational Therapy Treatment Patient Details Name: Randall [name] MRN: [id number] DOB: December 31, 1945 Today's Date: 09/19/2020    History of present illness Pt is a 75yo male from [facility] fell out of bed and hit head on recliner. 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 Labs reveal acute renal failure and anemia. PMH: Afib (on Apixaban).   OT comments  Patient awake, lying in bed, but not wanting to transfer to the recliner.  He was able to sit edge of bed with minimal assist for a few minutes, but unable to convince him to sit up for lunch.  Primary complaint of heartburn, and remaining deficits listed below. SNF continues to be recommended, OT backed him to 1x/wk due to continued refusal to participate with meaningful ADL based task.    Follow Up Recommendations  SNF    Equipment Recommendations  None recommended by OT    Recommendations for Other Services      Precautions / Restrictions Precautions Precautions: Fall Precaution Comments: R hemianopsia from prior CVA Restrictions Weight Bearing Restrictions: No       Mobility Bed Mobility Overal bed mobility: Needs Assistance Bed Mobility: Supine to Sit;Sit to Supine     Supine to sit: Min assist Sit to supine: Min assist     Patient Response: Flat affect  Transfers                 General transfer comment: declined OOB again, sat EOB to urinate    Balance Overall balance assessment: Needs assistance Sitting-balance support: Single extremity supported;Feet supported Sitting balance-Leahy Scale: Fair Sitting balance - Comments: cues to lean forward                                      Vision  No changes from PLOF     Perception     Praxis      Cognition Arousal/Alertness: Awake/alert Behavior During Therapy: Flat affect Overall Cognitive Status: Within Functional Limits for tasks assessed                                  General Comments: following commands, but has decreased safety with movement        Exercises     Shoulder Instructions       General Comments      Pertinent Vitals/ Pain       Pain Assessment: Faces Faces Pain Scale: Hurts a little bit Pain Location: heartburn Pain Descriptors / Indicators: Burning;Nagging Pain Intervention(s): Monitored during session                                                          Frequency  Min 1X/week        Progress Toward Goals  OT Goals(current goals can now be found in the care plan section)  Progress towards OT goals: Not progressing toward goals - comment (self limiting behaviors)  Acute Rehab OT Goals Patient Stated Goal: none stated OT Goal Formulation: Patient unable to participate in goal setting Time For Goal Achievement: 10/01/20 Potential to Achieve Goals: Fair  Plan  Discharge plan remains appropriate;Frequency needs to be updated    Co-evaluation                 AM-PAC OT "6 Clicks" Daily Activity     Outcome Measure   Help from another person eating meals?: A Little Help from another person taking care of personal grooming?: A Little Help from another person toileting, which includes using toliet, bedpan, or urinal?: A Lot Help from another person bathing (including washing, rinsing, drying)?: A Lot Help from another person to put on and taking off regular upper body clothing?: A Lot Help from another person to put on and taking off regular lower body clothing?: A Lot 6 Click Score: 14    End of Session    OT Visit Diagnosis: Unsteadiness on feet (R26.81);Muscle weakness (generalized) (M62.81);Repeated falls (R29.6)   Activity Tolerance Other (comment) (patient self limits OOB activity)   Patient Left in bed;with call bell/phone within reach;with bed alarm set   Nurse Communication Other (comment) (wanting something for  heartburn)        Time: 1594-5859 OT Time Calculation (min): 15 min  Charges: OT General Charges $OT Visit: 1 Visit OT Treatments $Therapeutic Activity: 8-22 mins  09/19/2020  RP, OTR/L  Acute Rehabilitation Services  Office:  [phone]    Randall [name] 09/19/2020, 11:59 AM

## 2020-09-19 NOTE — TOC Transition Note (Signed)
Transition of Care [facility]) - CM/SW Discharge Note   Patient Details  Name: Randall [name] MRN: [id number] Date of Birth: 25-Mar-1945  [facility] CM/SW Contact:  Baldemar Lenis, LCSW Phone Number: 09/19/2020, 2:06 PM   Clinical Narrative:   CSW spoke with patient's daughter, Mardella Layman, to discuss patient's needs. CSW obtained information on how patient was recently at [employer] but insurance denied due to his lack of participation, and how patient does not want to do anything but lay in the bed. CSW discussed recommendation for SNF for patient, and daughter does not want to pursue as the patient will not participate, plan to get patient back to [facility]. Mardella Layman asking about hospital bed, as the patient likes to rely on the rails but [facility] would not order one for the patient. CSW to look into hospital bed for the patient. CSW to follow.    Final next level of care: Assisted Living Barriers to Discharge: Barriers Resolved   Patient Goals and CMS Choice Patient states their goals for this hospitalization and ongoing recovery are:: patient unable to participate in goal setting, only oriented to self CMS Medicare.gov Compare Post Acute Care list provided to:: Patient Represenative (must comment) Choice offered to / list presented to : Adult Children  Discharge Placement              Patient chooses bed at: [facility] Patient to be transferred to facility by: Family Name of family member notified: Mardella Layman Patient and family notified of of transfer: 09/19/20  Discharge Plan and Services                                     Social Determinants of Health (SDOH) Interventions     Readmission Risk Interventions No flowsheet data found.

## 2020-09-19 NOTE — Plan of Care (Signed)

## 2020-09-19 NOTE — TOC CAGE-AID Note (Signed)
Transition of Care [facility]) - CAGE-AID Screening   Patient Details  Name: Randall [name] MRN: [id number] Date of Birth: 1945/10/09  Transition of Care [facility]) CM/SW Contact:    Lossie Faes Tarpley-Carter, LCSWA Phone Number: 09/19/2020, 1:57 PM   Clinical Narrative: Pt participated in Cage-Aid.  Pt stated he does not use substance or ETOH.  Pt was not offered resources, due to no usage of substance or ETOH.     Soledad Budreau Tarpley-Carter, MSW, LCSW-A Pronouns:  She/Her/Hers [facility]itions of Care Clinical Social Worker Direct Number:  [phone] [email or web address]   CAGE-AID Screening:    Have You Ever Felt You Ought to Cut Down on Your Drinking or Drug Use?: No Have People Annoyed You By [employer] Your Drinking Or Drug Use?: No Have You Felt Bad Or Guilty About Your Drinking Or Drug Use?: No Have You Ever Had a Drink or Used Drugs First Thing In The Morning to Steady Your Nerves or to Get Rid of a Hangover?: No CAGE-AID Score: 0  Substance Abuse Education Offered: No

## 2020-09-19 NOTE — NC FL2 (Signed)
[place] LEVEL OF CARE SCREENING TOOL     IDENTIFICATION  Patient Name: Randall [name] Birthdate: 1945/08/09 Sex: male Admission Date (Current Location): 09/17/2020  [facility] and [place] Number:  Herbalist and Address:  [facility] [place] [address] [place], [place] [address]      Provider Number: [id number]  Attending Physician Name and Address:  Caren Griffins, MD  Relative Name and Phone Number:       Current Level of Care: Hospital Recommended Level of Care: Assisted Living Facility Prior Approval Number:    Date Approved/Denied:   PASRR Number:    Discharge Plan: Other (Comment) (ALF)    Current Diagnoses: Patient Active Problem List   Diagnosis Date Noted   Stroke (cerebrum) ([place]) 09/19/2020   Acute CVA (cerebrovascular accident) ([place]) 09/17/2020   ARF (acute renal failure) ([place]) 09/17/2020   Normocytic anemia 09/17/2020    Orientation RESPIRATION BLADDER Height & Weight     Self, Time, Situation, Place  Normal Incontinent Weight: 215 lb (97.5 kg) Height:  _0  (177.8 cm)  BEHAVIORAL SYMPTOMS/MOOD NEUROLOGICAL BOWEL NUTRITION STATUS      Incontinent Diet  AMBULATORY STATUS COMMUNICATION OF NEEDS Skin   Extensive Assist Verbally Skin abrasions (left arm, head)                       Personal Care Assistance Level of Assistance  Bathing, Feeding, Dressing Bathing Assistance: Maximum assistance Feeding assistance: Limited assistance Dressing Assistance: Maximum assistance     Functional Limitations Info             SPECIAL CARE FACTORS FREQUENCY                       Contractures Contractures Info: Not present    Additional Factors Info  Code Status, Allergies Code Status Info: DNR Allergies Info: NKA           Current Medications (09/19/2020):  This is the current hospital active medication list Current Facility-Administered Medications  Medication Dose Route Frequency  Provider Last Rate Last Admin   acetaminophen (TYLENOL) tablet 650 mg  650 mg Oral Q4H PRN Rise Patience, MD       Or   acetaminophen (TYLENOL) 160 MG/5ML solution 650 mg  650 mg Per Tube Q4H PRN Rise Patience, MD       Or   acetaminophen (TYLENOL) suppository 650 mg  650 mg Rectal Q4H PRN Rise Patience, MD       alum & mag hydroxide-simeth (MAALOX/MYLANTA) 200-200-20 MG/5ML suspension 30 mL  30 mL Oral Q4H PRN Caren Griffins, MD       apixaban Arne Cleveland) tablet 5 mg  5 mg Oral BID Rolla Flatten, RPH   5 mg at 09/19/20 1026   cyanocobalamin ((VITAMIN B-12)) injection 1,000 mcg  1,000 mcg Intramuscular Once Caren Griffins, MD       hydrALAZINE (APRESOLINE) injection 10 mg  10 mg Intravenous Q4H PRN Rise Patience, MD       melatonin tablet 3 mg  3 mg Oral QHS PRN Opyd, Ilene Qua, MD   3 mg at 09/17/20 2319   ondansetron (ZOFRAN) injection 4 mg  4 mg Intravenous Q6H PRN Caren Griffins, MD   4 mg at 09/19/20 1043   Tdap (BOOSTRIX) injection 0.5 mL  0.5 mL Intramuscular Once Rise Patience, MD  Discharge Medications: acetaminophen 325 MG tablet Commonly known as: TYLENOL Take 650 mg by mouth every 6 (six) hours as needed (for pain).    apixaban 5 MG Tabs tablet Commonly known as: Eliquis Take 1 tablet (5 mg total) by mouth in the morning and at bedtime.    atorvastatin 40 MG tablet Commonly known as: LIPITOR Take 2 tablets (80 mg total) by mouth at bedtime.    blood glucose meter kit and supplies Dispense based on patient and insurance preference. Use up to four times daily as directed. (FOR ICD-10 E10.9, E11.9).    carvedilol 6.25 MG tablet Commonly known as: COREG Take 1 tablet (6.25 mg total) by mouth 2 (two) times daily with a meal.    dicyclomine 10 MG capsule Commonly known as: BENTYL Take 1 capsule (10 mg total) by mouth 4 (four) times daily -  before meals and at bedtime.    docusate sodium 100 MG capsule Commonly  known as: COLACE Take 1 capsule (100 mg total) by mouth in the morning and at bedtime.    glipiZIDE 5 MG tablet Commonly known as: GLUCOTROL Take 1 tablet (5 mg total) by mouth 2 (two) times daily before a meal.    hydrALAZINE 50 MG tablet Commonly known as: APRESOLINE Take 2 tablets (100 mg total) by mouth 3 (three) times daily.    insulin glargine (1 Unit Dial) 300 UNIT/ML Solostar Pen Commonly known as: TOUJEO Inject 5 Units into the skin daily.    INSULIN SYRINGE .5CC/30GX1/2" 30G X 1/2" 0.5 ML Misc 1 each by Does not apply route daily.    lipase/protease/amylase 12000-38000 units Cpep capsule Commonly known as: CREON Take 1 capsule (12,000 Units total) by mouth 4 (four) times daily.    meclizine 25 MG tablet Commonly known as: ANTIVERT Take 1 tablet (25 mg total) by mouth every 8 (eight) hours as needed for dizziness.    ondansetron 4 MG tablet Commonly known as: ZOFRAN Take 1 tablet (4 mg total) by mouth every 8 (eight) hours as needed for nausea or vomiting.    pantoprazole 40 MG tablet Commonly known as: PROTONIX Take 1 tablet (40 mg total) by mouth 2 (two) times daily before a meal.    polyethylene glycol powder 17 GM/SCOOP powder Commonly known as: GLYCOLAX/MIRALAX Take 17 g by mouth daily as needed for moderate constipation. What changed:  when to take this reasons to take this    valsartan 320 MG tablet Commonly known as: DIOVAN Take 1 tablet (320 mg total) by mouth daily.    vitamin B-12 1000 MCG tablet Commonly known as: CYANOCOBALAMIN Take 1 tablet (1,000 mcg total) by mouth daily.    Relevant Imaging Results:  Relevant Lab Results:   Additional [place], LCSW

## 2020-09-30 ENCOUNTER — Other Ambulatory Visit: Payer: Self-pay

## 2020-09-30 ENCOUNTER — Encounter (HOSPITAL_BASED_OUTPATIENT_CLINIC_OR_DEPARTMENT_OTHER): Payer: Self-pay | Admitting: *Deleted

## 2020-09-30 ENCOUNTER — Emergency Department (HOSPITAL_BASED_OUTPATIENT_CLINIC_OR_DEPARTMENT_OTHER)
Admission: EM | Admit: 2020-09-30 | Discharge: 2020-09-30 | Disposition: A | Discharge status: Home / Self Care | Payer: Medicare Other | Attending: Emergency Medicine | Admitting: Emergency Medicine

## 2020-09-30 DIAGNOSIS — R531 Weakness: Secondary | ICD-10-CM | POA: Diagnosis not present

## 2020-09-30 DIAGNOSIS — Z5321 Procedure and treatment not carried out due to patient leaving prior to being seen by health care provider: Secondary | ICD-10-CM | POA: Diagnosis not present

## 2020-09-30 HISTORY — DX: Cerebral infarction, unspecified: I63.9

## 2020-09-30 HISTORY — DX: Type 2 diabetes mellitus without complications: E11.9

## 2020-09-30 HISTORY — DX: Unspecified atrial fibrillation: I48.91

## 2020-09-30 HISTORY — DX: Essential (primary) hypertension: I10

## 2020-09-30 NOTE — ED Triage Notes (Signed)
C/o insomnia x 3 days , Pt is at [facility] x 2 weeks. Moved there from rehab after CVA , ambien dropped off [facility]

## 2020-10-25 ENCOUNTER — Emergency Department (HOSPITAL_COMMUNITY): Payer: Medicare Other

## 2020-10-25 ENCOUNTER — Emergency Department (HOSPITAL_COMMUNITY)
Admission: EM | Admit: 2020-10-25 | Discharge: 2020-10-26 | Disposition: A | Discharge status: Home / Self Care | Payer: Medicare Other | Attending: Emergency Medicine | Admitting: Emergency Medicine

## 2020-10-25 DIAGNOSIS — W19XXXA Unspecified fall, initial encounter: Secondary | ICD-10-CM

## 2020-10-25 DIAGNOSIS — F0390 Unspecified dementia without behavioral disturbance: Secondary | ICD-10-CM | POA: Diagnosis not present

## 2020-10-25 DIAGNOSIS — R410 Disorientation, unspecified: Secondary | ICD-10-CM | POA: Diagnosis not present

## 2020-10-25 DIAGNOSIS — S199XXA Unspecified injury of neck, initial encounter: Secondary | ICD-10-CM | POA: Insufficient documentation

## 2020-10-25 DIAGNOSIS — W010XXA Fall on same level from slipping, tripping and stumbling without subsequent striking against object, initial encounter: Secondary | ICD-10-CM | POA: Insufficient documentation

## 2020-10-25 DIAGNOSIS — S0990XA Unspecified injury of head, initial encounter: Secondary | ICD-10-CM | POA: Insufficient documentation

## 2020-10-25 DIAGNOSIS — Z794 Long term (current) use of insulin: Secondary | ICD-10-CM | POA: Diagnosis not present

## 2020-10-25 DIAGNOSIS — Z7901 Long term (current) use of anticoagulants: Secondary | ICD-10-CM | POA: Diagnosis not present

## 2020-10-25 LAB — CBC WITH DIFFERENTIAL/PLATELET
Abs Immature Granulocytes: 0.03 10*3/uL (ref 0.00–0.07)
Basophils Absolute: 0 10*3/uL (ref 0.0–0.1)
Basophils Relative: 1 %
Eosinophils Absolute: 0.1 10*3/uL (ref 0.0–0.5)
Eosinophils Relative: 1 %
HCT: 37.2 % — ABNORMAL LOW (ref 39.0–52.0)
Hemoglobin: 11.9 g/dL — ABNORMAL LOW (ref 13.0–17.0)
Immature Granulocytes: 1 %
Lymphocytes Relative: 17 %
Lymphs Abs: 1.1 10*3/uL (ref 0.7–4.0)
MCH: 29.1 pg (ref 26.0–34.0)
MCHC: 32 g/dL (ref 30.0–36.0)
MCV: 91 fL (ref 80.0–100.0)
Monocytes Absolute: 0.8 10*3/uL (ref 0.1–1.0)
Monocytes Relative: 11 %
Neutro Abs: 4.6 10*3/uL (ref 1.7–7.7)
Neutrophils Relative %: 69 %
Platelets: 243 10*3/uL (ref 150–400)
RBC: 4.09 MIL/uL — ABNORMAL LOW (ref 4.22–5.81)
RDW: 14.4 % (ref 11.5–15.5)
WBC: 6.6 10*3/uL (ref 4.0–10.5)
nRBC: 0 % (ref 0.0–0.2)

## 2020-10-25 LAB — BASIC METABOLIC PANEL
Anion gap: 9 (ref 5–15)
BUN: 20 mg/dL (ref 8–23)
CO2: 27 mmol/L (ref 22–32)
Calcium: 9 mg/dL (ref 8.9–10.3)
Chloride: 100 mmol/L (ref 98–111)
Creatinine, Ser: 1.31 mg/dL — ABNORMAL HIGH (ref 0.61–1.24)
GFR, Estimated: 56 mL/min — ABNORMAL LOW (ref >60)
Glucose, Bld: 283 mg/dL — ABNORMAL HIGH (ref 70–99)
Potassium: 4.8 mmol/L (ref 3.5–5.1)
Sodium: 136 mmol/L (ref 135–145)

## 2020-10-25 LAB — TROPONIN I (HIGH SENSITIVITY)
Troponin I (High Sensitivity): 22 ng/L — ABNORMAL HIGH (ref <18)
Troponin I (High Sensitivity): 20 ng/L — ABNORMAL HIGH (ref <18)

## 2020-10-25 NOTE — ED Provider Notes (Signed)
[facility] EMERGENCY DEPARTMENT Provider Note   CSN: [id number] Arrival date & time: 10/25/20  1608     History No chief complaint on file.   Jeremia Groot is a 75 y.o. male.  Patient presents with complaint of ground-level fall.  He states he lost his balance and fell but denies loss of consciousness.  Denies any specific localized pain.  Denies any headache or neck pain or back pain no fever no cough no vomiting or diarrhea reported.      No past medical history on file.  There are no problems to display for this patient.      No family history on file.     Home Medications Prior to Admission medications   Medication Sig Start Date End Date Taking? Authorizing Provider  acetaminophen (TYLENOL) 325 MG tablet Take 650 mg by mouth every 6 (six) hours as needed (pain).   Yes [provider]  apixaban (ELIQUIS) 5 MG TABS tablet Take 5 mg by mouth 2 (two) times daily.   Yes [provider]  aspirin-sod bicarb-citric acid (ALKA-SELTZER ORIGINAL) 325 MG TBEF tablet Take 650 mg by mouth daily as needed (heartburn).   Yes [provider]  atorvastatin (LIPITOR) 40 MG tablet Take 80 mg by mouth at bedtime.   Yes [provider]  carvedilol (COREG) 6.25 MG tablet Take 6.25 mg by mouth 2 (two) times daily.   Yes [provider]  dapagliflozin propanediol (FARXIGA) 10 MG TABS tablet Take 10 mg by mouth every morning.   Yes [provider]  dicyclomine (BENTYL) 10 MG capsule Take 10 mg by mouth 4 (four) times daily.   Yes [provider]  docusate sodium (COLACE) 100 MG capsule Take 100 mg by mouth 2 (two) times daily.   Yes [provider]  glipiZIDE (GLUCOTROL) 5 MG tablet Take 5 mg by mouth 2 (two) times daily.   Yes [provider]  haloperidol (HALDOL) 0.5 MG tablet Take 0.5 mg by mouth every morning.   Yes [provider]  hydrALAZINE (APRESOLINE) 50 MG tablet Take 100 mg by  mouth 3 (three) times daily.   Yes [provider]  insulin glargine (LANTUS) 100 UNIT/ML injection Inject 8 Units into the skin every morning.   Yes [provider]  lipase/protease/amylase (CREON) 12000-38000 units CPEP capsule Take 12,000 Units by mouth 4 (four) times daily.   Yes [provider]  meclizine (ANTIVERT) 25 MG tablet Take 25 mg by mouth every 8 (eight) hours as needed for dizziness.   Yes [provider]  mirtazapine (REMERON) 7.5 MG tablet Take 7.5 mg by mouth at bedtime.   Yes [provider]  ondansetron (ZOFRAN) 4 MG tablet Take 4 mg by mouth See admin instructions. Take one tablet (4 mg) by mouth three times daily, may also take one tablet (4 mg) every 8 hours as needed for nausea/vomiting - max 3 tablets/24 hours   Yes [provider]  pantoprazole (PROTONIX) 40 MG tablet Take 40 mg by mouth 2 (two) times daily.   Yes [provider]  polyethylene glycol (MIRALAX / GLYCOLAX) 17 g packet Take 17 g by mouth daily as needed (constipation).   Yes [provider]  sertraline (ZOLOFT) 25 MG tablet Take 25 mg by mouth every morning.   Yes [provider]  valsartan (DIOVAN) 320 MG tablet Take 320 mg by mouth every morning.   Yes [provider]  vitamin B-12 (CYANOCOBALAMIN) 1000 MCG tablet  Take 1,000 mcg by mouth every morning.   Yes [provider]  zolpidem (AMBIEN) 5 MG tablet Take 5 mg by mouth at bedtime.   Yes [provider]    Allergies    Patient has no known allergies.  Review of Systems   Review of Systems  Constitutional:  Negative for fever.  HENT:  Negative for ear pain and sore throat.   Eyes:  Negative for pain.  Respiratory:  Negative for cough.   Cardiovascular:  Negative for chest pain.  Gastrointestinal:  Negative for abdominal pain.  Genitourinary:  Negative for flank pain.  Musculoskeletal:  Negative for back pain.  Skin:  Negative for color  change and rash.  Neurological:  Negative for syncope.  All other systems reviewed and are negative.  Physical Exam Updated Vital Signs BP (!) 169/90   Pulse 87   Temp 98 F (36.7 C) (Oral)   Resp 14   Ht 5\' 10"  (1.778 m)   Wt 97.5 kg   SpO2 99%   BMI 30.85 kg/m   Physical Exam Constitutional:      Appearance: He is well-developed.  HENT:     Head: Normocephalic.     Nose: Nose normal.  Eyes:     Extraocular Movements: Extraocular movements intact.  Cardiovascular:     Rate and Rhythm: Normal rate.  Pulmonary:     Effort: Pulmonary effort is normal.  Musculoskeletal:        General: Normal range of motion.     Comments: No C or T or L-spine midline step-offs or tenderness noted.  Patient ranging his shoulders elbows wrists and hips knees and ankles without pain or discomfort.  Skin:    Coloration: Skin is not jaundiced.  Neurological:     General: No focal deficit present.     Mental Status: He is alert and oriented to person, place, and time. Mental status is at baseline.     Cranial Nerves: No cranial nerve deficit.     Motor: No weakness.    ED Results / Procedures / Treatments   Labs (all labs ordered are listed, but only abnormal results are displayed) Labs Reviewed  CBC WITH DIFFERENTIAL/PLATELET - Abnormal; Notable for the following components:      Result Value   RBC 4.09 (*)    Hemoglobin 11.9 (*)    HCT 37.2 (*)    All other components within normal limits  BASIC METABOLIC PANEL - Abnormal; Notable for the following components:   Glucose, Bld 283 (*)    Creatinine, Ser 1.31 (*)    GFR, Estimated 56 (*)    All other components within normal limits  TROPONIN I (HIGH SENSITIVITY) - Abnormal; Notable for the following components:   Troponin I (High Sensitivity) 22 (*)    All other components within normal limits  TROPONIN I (HIGH SENSITIVITY) - Abnormal; Notable for the following components:   Troponin I (High Sensitivity) 20 (*)    All other  components within normal limits    EKG EKG Interpretation  Date/Time:  Friday October 25 2020 16:27:51 EDT Ventricular Rate:  55 PR Interval:  238 QRS Duration: 154 QT Interval:  462 QTC Calculation: 442 R Axis:   91 Text Interpretation: Sinus rhythm Prolonged PR interval RBBB and LPFB Baseline wander in lead(s) II III aVF Confirmed by [id number] ([id number]) on 10/25/2020 4:30:57 PM  Radiology CT Head Wo Contrast  Result Date: 10/25/2020 CLINICAL DATA:  12/25/2020, anticoagulated EXAM: CT HEAD WITHOUT CONTRAST  TECHNIQUE: Contiguous axial images were obtained from the base of the skull through the vertex without intravenous contrast. COMPARISON:  09/17/2020, 09/18/2020 FINDINGS: Brain: No acute infarct or hemorrhage. Chronic encephalomalacia within the left occipital lobe, as well as chronic small-vessel ischemic changes throughout the periventricular and subcortical white matter, stable since prior study. 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 Lateral ventricles and midline structures are unremarkable. No acute extra-axial fluid collection. No mass effect. Vascular: No hyperdense vessel or unexpected calcification. Skull: Normal. Negative for fracture or focal lesion. Sinuses/Orbits: No acute finding. Other: None. IMPRESSION: 1. No significant change in the subacute left cerebellar infarct, chronic left occipital infarct, and chronic white matter ischemic changes seen previously. 2. No acute infarct or hemorrhage. Electronically Signed   By: Sharlet Salina M.D.   On: 10/25/2020 17:25   CT Cervical Spine Wo Contrast  Result Date: 10/25/2020 CLINICAL DATA:  Larey Seat, anticoagulated EXAM: CT CERVICAL SPINE WITHOUT CONTRAST TECHNIQUE: Multidetector CT imaging of the cervical spine was performed without intravenous contrast. Multiplanar CT image reconstructions were also generated. COMPARISON:  09/17/2020 FINDINGS:  Alignment: Alignment is anatomic. Skull base and vertebrae: No acute fracture. No primary bone lesion or focal pathologic process. Soft tissues and spinal canal: No prevertebral fluid or swelling. No visible canal hematoma. Disc levels: Multilevel spondylosis and facet hypertrophy most pronounced at the C5-6 level, stable. Upper chest: Airway is patent.  Lung apices are clear. Other: Reconstructed images demonstrate no additional findings. IMPRESSION: 1. No acute cervical spine fracture. 2. Stable lower cervical spondylosis. Electronically Signed   By: Sharlet Salina M.D.   On: 10/25/2020 17:27    Procedures Procedures   Medications Ordered in ED Medications - No data to display  ED Course  I have reviewed the triage vital signs and the nursing notes.  Pertinent labs & imaging results that were available during my care of the patient were reviewed by me and considered in my medical decision making (see chart for details).    MDM Rules/Calculators/A&P                           Labs and imaging unremarkable CT imaging shows no acute pathology noted.  EKG was abnormal appearing but no ST elevations no T wave inversions noted.  QRS appears moderately widened however.  No prior EKG for comparison.  Rate is otherwise normal.  Given abnormal EKG labs are sent.  Initial troponin mildly elevated, patient has no complaints of chest pain or shortness of breath.  Repeat troponin continues to be unchanged.  Patient advised outpatient follow-up with his doctor within the week.  Advising immediate return for fevers pain weakness numbness or any additional concerns.  Final Clinical Impression(s) / ED Diagnoses Final diagnoses:  Fall, initial encounter    Rx / DC Orders ED Discharge Orders     None        Lou­za, Eustace Moore, MD 10/25/20 Barry Brunner

## 2020-10-25 NOTE — Progress Notes (Signed)
Orthopedic Tech Progress Note Patient Details:  Randall [name] 01/19/1877 [id number] Level 2 Trauma  Patient ID: Randall [name], male   DOB: 01/19/1877, 75 y.o.   MRN: [id number]  Randall [name] 10/25/2020, 4:11 PM

## 2020-10-25 NOTE — ED Notes (Signed)
This RN attempted to call nursing report x 2 to [facility]

## 2020-10-25 NOTE — ED Notes (Signed)
Secretary to call [facility] for transport back to facility  °

## 2020-10-25 NOTE — ED Notes (Signed)
[facility] called, 15 ahead

## 2020-10-25 NOTE — Discharge Instructions (Addendum)
Call your primary care doctor or specialist as discussed in the next 2-3 days.   Return immediately back to the ER if:  Your symptoms worsen within the next 12-24 hours. You develop new symptoms such as new fevers, persistent vomiting, new pain, shortness of breath, or new weakness or numbness, or if you have any other concerns.  

## 2020-10-25 NOTE — Progress Notes (Signed)
   10/25/20 1551  Clinical Encounter Type  Visited With Patient not available  Visit Type Trauma  Referral From Nurse  Consult/Referral To Chaplain   Chaplain received Level 2 page - fall on thinners-  Chaplain advised unit to call if needed. Advised Chaplain remains available for follow up spiritual and emotional support as needed. This note was prepared by Deneen Harts, M.Div..  For questions please contact by phone [phone].

## 2020-10-25 NOTE — ED Notes (Signed)
Pt transported to CT ?

## 2020-10-26 ENCOUNTER — Emergency Department (HOSPITAL_COMMUNITY)
Admission: EM | Admit: 2020-10-26 | Discharge: 2020-10-27 | Disposition: A | Discharge status: Home / Self Care | Payer: Medicare Other | Source: Home / Self Care | Attending: Emergency Medicine | Admitting: Emergency Medicine

## 2020-10-26 ENCOUNTER — Encounter (HOSPITAL_COMMUNITY): Payer: Self-pay

## 2020-10-26 ENCOUNTER — Other Ambulatory Visit: Payer: Self-pay

## 2020-10-26 ENCOUNTER — Emergency Department (HOSPITAL_COMMUNITY): Payer: Medicare Other

## 2020-10-26 DIAGNOSIS — W19XXXA Unspecified fall, initial encounter: Secondary | ICD-10-CM

## 2020-10-26 DIAGNOSIS — Z7901 Long term (current) use of anticoagulants: Secondary | ICD-10-CM | POA: Insufficient documentation

## 2020-10-26 DIAGNOSIS — R410 Disorientation, unspecified: Secondary | ICD-10-CM | POA: Insufficient documentation

## 2020-10-26 DIAGNOSIS — F0390 Unspecified dementia without behavioral disturbance: Secondary | ICD-10-CM | POA: Insufficient documentation

## 2020-10-26 DIAGNOSIS — Z794 Long term (current) use of insulin: Secondary | ICD-10-CM | POA: Insufficient documentation

## 2020-10-26 DIAGNOSIS — S199XXA Unspecified injury of neck, initial encounter: Secondary | ICD-10-CM | POA: Insufficient documentation

## 2020-10-26 DIAGNOSIS — S0990XA Unspecified injury of head, initial encounter: Secondary | ICD-10-CM | POA: Insufficient documentation

## 2020-10-26 DIAGNOSIS — W1839XA Other fall on same level, initial encounter: Secondary | ICD-10-CM | POA: Insufficient documentation

## 2020-10-26 DIAGNOSIS — Y92129 Unspecified place in nursing home as the place of occurrence of the external cause: Secondary | ICD-10-CM

## 2020-10-26 LAB — CBC WITH DIFFERENTIAL/PLATELET
Abs Immature Granulocytes: 0.05 10*3/uL (ref 0.00–0.07)
Basophils Absolute: 0 10*3/uL (ref 0.0–0.1)
Basophils Relative: 0 %
Eosinophils Absolute: 0 10*3/uL (ref 0.0–0.5)
Eosinophils Relative: 0 %
HCT: 41.4 % (ref 39.0–52.0)
Hemoglobin: 13.1 g/dL (ref 13.0–17.0)
Immature Granulocytes: 1 %
Lymphocytes Relative: 13 %
Lymphs Abs: 1.3 10*3/uL (ref 0.7–4.0)
MCH: 29 pg (ref 26.0–34.0)
MCHC: 31.6 g/dL (ref 30.0–36.0)
MCV: 91.8 fL (ref 80.0–100.0)
Monocytes Absolute: 1 10*3/uL (ref 0.1–1.0)
Monocytes Relative: 10 %
Neutro Abs: 7.9 10*3/uL — ABNORMAL HIGH (ref 1.7–7.7)
Neutrophils Relative %: 76 %
Platelets: 333 10*3/uL (ref 150–400)
RBC: 4.51 MIL/uL (ref 4.22–5.81)
RDW: 14.6 % (ref 11.5–15.5)
WBC: 10.4 10*3/uL (ref 4.0–10.5)
nRBC: 0 % (ref 0.0–0.2)

## 2020-10-26 LAB — BASIC METABOLIC PANEL
Anion gap: 10 (ref 5–15)
BUN: 21 mg/dL (ref 8–23)
CO2: 25 mmol/L (ref 22–32)
Calcium: 9.4 mg/dL (ref 8.9–10.3)
Chloride: 103 mmol/L (ref 98–111)
Creatinine, Ser: 1.43 mg/dL — ABNORMAL HIGH (ref 0.61–1.24)
GFR, Estimated: 50 mL/min — ABNORMAL LOW (ref >60)
Glucose, Bld: 183 mg/dL — ABNORMAL HIGH (ref 70–99)
Potassium: 4.4 mmol/L (ref 3.5–5.1)
Sodium: 138 mmol/L (ref 135–145)

## 2020-10-26 LAB — PROTIME-INR
INR: 1.5 — ABNORMAL HIGH (ref 0.8–1.2)
Prothrombin Time: 18.4 seconds — ABNORMAL HIGH (ref 11.4–15.2)

## 2020-10-26 LAB — CBG MONITORING, ED: Glucose-Capillary: 205 mg/dL — ABNORMAL HIGH (ref 70–99)

## 2020-10-26 NOTE — ED Provider Notes (Addendum)
Emergency Medicine Provider Triage Evaluation Note  Eddison Searls , a 75 y.o. male  was evaluated in triage.  Pt complains of unwitnessed fall at nursing facility.  History of dementia.  Unclear if hit head.  He was found on the floor.  Patient has no complaints.  He has bruising and scabs to various areas.  He moves all 4 extremities without difficulty.  Was seen here yesterday for follow-up  Review of Systems  Positive: Fall Negative:   Physical Exam  There were no vitals taken for this visit. Gen:   Awake, no distress   Head:  Mild tenderness to frontal aspect head Neck:  C collar presents Resp:  Normal effort  MSK:   Moves extremities without difficulty, multiple areas of abrasions, contusions, skin tears Other:    Medical Decision Making  Medically screening exam initiated at 10:22 PM.  Appropriate orders placed.  Arian Murley was informed that the remainder of the evaluation will be completed by another provider, this initial triage assessment does not replace that evaluation, and the importance of remaining in the ED until their evaluation is complete.  Patient unwitnessed fall, unclear if hit head however does have some abrasions.  Per his chart as well as papers from his nursing facility patient is on Eliquis.  Last dose today per chart.  Patient level 2 trauma due to fall on thinners.  Nursing notified patient needs room in back      Kayelynn Abdou A, PA-C 10/26/20 2222    Jacalyn Lefevre, MD 10/26/20 2330

## 2020-10-26 NOTE — ED Triage Notes (Addendum)
Pt BIB [facility] from [facility] after sustaining an unwitnessed fall. C-collar in place. No obvious injury. A&O at baseline HX dementia. On assessment pt denies pain.

## 2020-10-26 NOTE — ED Notes (Signed)
Secretary to page out Trauma Level 2

## 2020-10-26 NOTE — ED Provider Notes (Signed)
[facility] EMERGENCY DEPARTMENT Provider Note   CSN: [id number] Arrival date & time: 10/26/20  2210     History Chief Complaint  Patient presents with   Randall [name] is a 75 y.o. male witnessed versus unwitnessed fall and at his care facility.  There is some discussion that patient hit the front of his head, patient is unable to give any history, collateral history was attempted to be collected from care facility, however they had just undergone at shift change, and did not know much about the incident, no incident report on file.  Patient is taking Eliquis, however patient does not know for what he is taking Eliquis.  Patient denies pain in any location.  Patient did have an episode of nausea and vomiting while being transferred from waiting room to [facility] bed.  Level 5 caveat: Dementia   Fall      History reviewed. No pertinent past medical history.  There are no problems to display for this patient.   History reviewed. No pertinent surgical history.     No family history on file.     Home Medications Prior to Admission medications   Medication Sig Start Date End Date Taking? Authorizing Provider  acetaminophen (TYLENOL) 325 MG tablet Take 650 mg by mouth every 6 (six) hours as needed (pain).    [provider]  apixaban (ELIQUIS) 5 MG TABS tablet Take 5 mg by mouth 2 (two) times daily.    [provider]  aspirin-sod bicarb-citric acid (ALKA-SELTZER ORIGINAL) 325 MG TBEF tablet Take 650 mg by mouth daily as needed (heartburn).    [provider]  atorvastatin (LIPITOR) 40 MG tablet Take 80 mg by mouth at bedtime.    [provider]  carvedilol (COREG) 6.25 MG tablet Take 6.25 mg by mouth 2 (two) times daily.    [provider]  dapagliflozin propanediol (FARXIGA) 10 MG TABS tablet Take 10 mg by mouth every morning.    [provider]  dicyclomine (BENTYL) 10 MG capsule Take 10 mg by  mouth 4 (four) times daily.    [provider]  docusate sodium (COLACE) 100 MG capsule Take 100 mg by mouth 2 (two) times daily.    [provider]  glipiZIDE (GLUCOTROL) 5 MG tablet Take 5 mg by mouth 2 (two) times daily.    [provider]  haloperidol (HALDOL) 0.5 MG tablet Take 0.5 mg by mouth every morning.    [provider]  hydrALAZINE (APRESOLINE) 50 MG tablet Take 100 mg by mouth 3 (three) times daily.    [provider]  insulin glargine (LANTUS) 100 UNIT/ML injection Inject 8 Units into the skin every morning.    [provider]  lipase/protease/amylase (CREON) 12000-38000 units CPEP capsule Take 12,000 Units by mouth 4 (four) times daily.    [provider]  meclizine (ANTIVERT) 25 MG tablet Take 25 mg by mouth every 8 (eight) hours as needed for dizziness.    [provider]  mirtazapine (REMERON) 7.5 MG tablet Take 7.5 mg by mouth at bedtime.    [provider]  ondansetron (ZOFRAN) 4 MG tablet Take 4 mg by mouth See admin instructions. Take one tablet (4 mg) by mouth three times daily, may also take one tablet (4 mg) every 8 hours as needed for nausea/vomiting - max 3 tablets/24 hours    [provider]  pantoprazole (PROTONIX) 40 MG tablet Take 40 mg by mouth 2 (two)  times daily.    [provider]  polyethylene glycol (MIRALAX / GLYCOLAX) 17 g packet Take 17 g by mouth daily as needed (constipation).    [provider]  sertraline (ZOLOFT) 25 MG tablet Take 25 mg by mouth every morning.    [provider]  valsartan (DIOVAN) 320 MG tablet Take 320 mg by mouth every morning.    [provider]  vitamin B-12 (CYANOCOBALAMIN) 1000 MCG tablet Take 1,000 mcg by mouth every morning.    [provider]  zolpidem (AMBIEN) 5 MG tablet Take 5 mg by mouth at bedtime.    [provider]    Allergies    Patient has no known allergies.  Review of  Systems   Review of Systems  Reason unable to perform ROS: Dementia.   Physical Exam Updated Vital Signs BP (!) 148/72 Comment: manual   Pulse 92   Temp 98.3 F (36.8 C)   Resp 18   Ht 5\' 10"  (1.778 m)   Wt 97.5 kg   SpO2 95%   BMI 30.85 kg/m   Physical Exam Vitals and nursing note reviewed.  Constitutional:      Appearance: Normal appearance.     Comments: Alert awake somewhat disheveled, foul-smelling patient sitting in bed in no acute distress.  Evidence of many recent excoriations from prior falls located throughout body.  HENT:     Head: Normocephalic.     Comments: Excoriations throughout head, and on scalp, potentially some tenderness to palpation of the frontal forehead, without hematoma or laceration Eyes:     General:        Right eye: No discharge.        Left eye: No discharge.  Neck:     Comments: No tenderness to palpation of midline spine based on patient reaction, no step-offs or deformity noted Cardiovascular:     Rate and Rhythm: Normal rate and regular rhythm.     Heart sounds: No murmur heard.   No friction rub. No gallop.  Pulmonary:     Effort: Pulmonary effort is normal.     Breath sounds: Normal breath sounds.  Abdominal:     General: Bowel sounds are normal.     Palpations: Abdomen is soft.  Skin:    General: Skin is warm and dry.     Capillary Refill: Capillary refill takes less than 2 seconds.  Neurological:     Mental Status: He is alert. He is disoriented.  Psychiatric:        Mood and Affect: Mood normal.        Behavior: Behavior normal.    ED Results / Procedures / Treatments   Labs (all labs ordered are listed, but only abnormal results are displayed) Labs Reviewed  CBC WITH DIFFERENTIAL/PLATELET - Abnormal; Notable for the following components:      Result Value   Neutro Abs 7.9 (*)    All other components within normal limits  BASIC METABOLIC PANEL - Abnormal; Notable for the following components:   Glucose, Bld 183 (*)     Creatinine, Ser 1.43 (*)    GFR, Estimated 50 (*)    All other components within normal limits  PROTIME-INR - Abnormal; Notable for the following components:   Prothrombin Time 18.4 (*)    INR 1.5 (*)    All other components within normal limits  CBG MONITORING, ED - Abnormal; Notable for the following components:   Glucose-Capillary 205 (*)    All other components within  normal limits  URINALYSIS, ROUTINE W REFLEX MICROSCOPIC    EKG None  Radiology CT HEAD WO CONTRAST ( )  Result Date: 10/26/2020 CLINICAL DATA:  Head and neck trauma EXAM: CT HEAD WITHOUT CONTRAST TECHNIQUE: Contiguous axial images were obtained from the base of the skull through the vertex without intravenous contrast. COMPARISON:  None. FINDINGS: Brain: There is no mass, hemorrhage or extra-axial collection. There is generalized atrophy without lobar predilection. Hypodensity of the white matter is most commonly associated with chronic microvascular disease. 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 Vascular: No abnormal hyperdensity of the major intracranial arteries or dural venous sinuses. No intracranial atherosclerosis. Skull: The visualized skull base, calvarium and extracranial soft tissues are normal. Sinuses/Orbits: No fluid levels or advanced mucosal thickening of the visualized paranasal sinuses. No mastoid or middle ear effusion. The orbits are normal. IMPRESSION: 1. No acute intracranial abnormality. 2. 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 3. Generalized atrophy and chronic ischemic microangiopathy Electronically Signed   By: Deatra Robinson M.D.   On: 10/26/2020 23:26   CT Head Wo Contrast  Result Date: 10/25/2020 CLINICAL DATA:  Larey Seat, anticoagulated EXAM: CT HEAD WITHOUT CONTRAST TECHNIQUE: Contiguous axial images were obtained from the base of the skull through the vertex without intravenous  contrast. COMPARISON:  09/17/2020, 09/18/2020 FINDINGS: Brain: No acute infarct or hemorrhage. Chronic encephalomalacia within the left occipital lobe, as well as chronic small-vessel ischemic changes throughout the periventricular and subcortical white matter, stable since prior study. 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 Lateral ventricles and midline structures are unremarkable. No acute extra-axial fluid collection. No mass effect. Vascular: No hyperdense vessel or unexpected calcification. Skull: Normal. Negative for fracture or focal lesion. Sinuses/Orbits: No acute finding. Other: None. IMPRESSION: 1. No significant change in the subacute left cerebellar infarct, chronic left occipital infarct, and chronic white matter ischemic changes seen previously. 2. No acute infarct or hemorrhage. Electronically Signed   By: Sharlet Salina M.D.   On: 10/25/2020 17:25   CT Cervical Spine Wo Contrast  Result Date: 10/26/2020 CLINICAL DATA:  Trauma EXAM: CT CERVICAL SPINE WITHOUT CONTRAST TECHNIQUE: Multidetector CT imaging of the cervical spine was performed without intravenous contrast. Multiplanar CT image reconstructions were also generated. COMPARISON:  None. FINDINGS: Alignment: No static subluxation. Facets are aligned. Occipital condyles and the lateral masses of C1 and C2 are normally approximated. Skull base and vertebrae: No acute fracture. Soft tissues and spinal canal: No prevertebral fluid or swelling. No visible canal hematoma. Disc levels: No advanced spinal canal or neural foraminal stenosis. Upper chest: No pneumothorax, pulmonary nodule or pleural effusion. Other: Normal visualized paraspinal cervical soft tissues. IMPRESSION: No acute fracture or static subluxation of the cervical spine. Electronically Signed   By: Deatra Robinson M.D.   On: 10/26/2020 23:37   CT Cervical Spine Wo Contrast  Result Date:  10/25/2020 CLINICAL DATA:  Larey Seat, anticoagulated EXAM: CT CERVICAL SPINE WITHOUT CONTRAST TECHNIQUE: Multidetector CT imaging of the cervical spine was performed without intravenous contrast. Multiplanar CT image reconstructions were also generated. COMPARISON:  09/17/2020 FINDINGS: Alignment: Alignment is anatomic. Skull base and vertebrae: No acute fracture. No primary bone lesion or focal pathologic process. Soft tissues and spinal canal: No prevertebral fluid or swelling. No visible canal hematoma. Disc levels: Multilevel spondylosis and facet hypertrophy most pronounced at the C5-6 level, stable. Upper chest: Airway  is patent.  Lung apices are clear. Other: Reconstructed images demonstrate no additional findings. IMPRESSION: 1. No acute cervical spine fracture. 2. Stable lower cervical spondylosis. Electronically Signed   By: Sharlet Salina M.D.   On: 10/25/2020 17:27    Procedures Procedures   Medications Ordered in ED Medications - No data to display  ED Course  I have reviewed the triage vital signs and the nursing notes.  Pertinent labs & imaging results that were available during my care of the patient were reviewed by me and considered in my medical decision making (see chart for details).    MDM Rules/Calculators/A&P                         Baseline dementia, frequent falls, afebrile, stable vital signs.  Patient was evaluated yesterday for similar complaint.  Will obtain CT of the head and neck to rule out intracranial abnormality especially in context of anticoagulation and potentially unwitnessed fall.  As patient has been having more frequent falls, will obtain basic lab work, clotting factors, as well as urinalysis to assess for potential causes of recent frequent falls.  I do believe that the care home environment with report that patient should be accompanied for every transfer, and has not been does account for some of his recent falls.  CT head without abnormality.  CT spine  revealed no acute abnormality.  Urinalysis still pending at this time.  Lab work significant for slightly elevated blood glucose without evidence of DKA or HHS.  Mildly increased creatinine from yesterday.  No white count to suggest occult infection.  No evidence of bedsores, sacral ulcer.  12:01 AM Care of Ajeet Casasola transferred to Dr. Eudelia Bunch at the end of my shift as the patient will require reassessment once labs/imaging have resulted. Patient presentation, ED course, and plan of care discussed with review of all pertinent labs and imaging. Please see his/her note for further details regarding further ED course and disposition. Plan at time of handoff is discharged to home pending negative urinalysis with instructions for close evaluation, accompaniment for all transfers to prevent frequent ground falls. This may be altered or completely changed at the discretion of the oncoming team pending results of further workup.   Final Clinical Impression(s) / ED Diagnoses Final diagnoses:  None    Rx / DC Orders ED Discharge Orders     None        Olene Floss, PA-C 10/27/20 0001    Wynetta Fines, MD 10/28/20 2330

## 2020-10-26 NOTE — ED Triage Notes (Addendum)
Pt is a fall on blood thinners.

## 2020-10-26 NOTE — ED Notes (Signed)
Pt found wandering hallways naked peeing on floor behind nursing station. Pt redirected to bed. Clothes changed. PTAR present for transportation back to facility.

## 2020-10-27 LAB — URINALYSIS, ROUTINE W REFLEX MICROSCOPIC
Bacteria, UA: NONE SEEN
Bilirubin Urine: NEGATIVE
Glucose, UA: >=500 mg/dL — AB
Hgb urine dipstick: NEGATIVE
Ketones, ur: NEGATIVE mg/dL
Leukocytes,Ua: NEGATIVE
Nitrite: NEGATIVE
Protein, ur: 100 mg/dL — AB
Specific Gravity, Urine: 1.023 (ref 1.005–1.030)
pH: 5 (ref 5.0–8.0)

## 2020-10-27 NOTE — ED Notes (Signed)
Discussed with Dr. Eudelia Bunch about pt's NPO status. Pt allowed to drink fluids per MD. Pt noted to be incontinent of foul smelling urine. Linens changed and pt cleaned by staff. Condom catheter applied.

## 2020-10-27 NOTE — ED Provider Notes (Signed)
I assumed care of this patient.  Please see previous provider note for further details of Hx, PE.  Briefly patient is a 75 y.o. male who presented who presented as a level 2 trauma, fall on blood thinners.  Negative CT head.  Pending read for cervical spine and UA.  CT cervical spine negative for any acute fractures.  UA without evidence of infection.  The patient appears reasonably screened and/or stabilized for discharge and I doubt any other medical condition or other [facility] requiring further screening, evaluation, or treatment in the ED at this time prior to discharge. Safe for discharge with strict return precautions.  Disposition: Discharge back to skilled nursing facility  Condition: Good   ED Discharge Orders     None        Follow Up: Primary care provider  Call  as needed         Solmon Bohr, Amadeo Garnet, MD 10/27/20 [phone]

## 2020-10-28 ENCOUNTER — Encounter (HOSPITAL_BASED_OUTPATIENT_CLINIC_OR_DEPARTMENT_OTHER): Payer: Self-pay | Admitting: *Deleted

## 2020-11-07 ENCOUNTER — Emergency Department (HOSPITAL_COMMUNITY): Payer: Medicare Other

## 2020-11-07 ENCOUNTER — Inpatient Hospital Stay (HOSPITAL_COMMUNITY)
Admission: EM | Admit: 2020-11-07 | Discharge: 2020-11-15 | DRG: 065 | Disposition: A | Discharge status: Hospice | Payer: Medicare Other | Source: Skilled Nursing Facility | Attending: Internal Medicine | Admitting: Internal Medicine

## 2020-11-07 DIAGNOSIS — I639 Cerebral infarction, unspecified: Secondary | ICD-10-CM | POA: Diagnosis not present

## 2020-11-07 DIAGNOSIS — Z7982 Long term (current) use of aspirin: Secondary | ICD-10-CM

## 2020-11-07 DIAGNOSIS — Z781 Physical restraint status: Secondary | ICD-10-CM

## 2020-11-07 DIAGNOSIS — E1122 Type 2 diabetes mellitus with diabetic chronic kidney disease: Secondary | ICD-10-CM | POA: Diagnosis present

## 2020-11-07 DIAGNOSIS — N182 Chronic kidney disease, stage 2 (mild): Secondary | ICD-10-CM | POA: Diagnosis present

## 2020-11-07 DIAGNOSIS — Z66 Do not resuscitate: Secondary | ICD-10-CM | POA: Diagnosis present

## 2020-11-07 DIAGNOSIS — G8321 Monoplegia of upper limb affecting right dominant side: Secondary | ICD-10-CM | POA: Diagnosis present

## 2020-11-07 DIAGNOSIS — R627 Adult failure to thrive: Secondary | ICD-10-CM | POA: Diagnosis present

## 2020-11-07 DIAGNOSIS — Z7901 Long term (current) use of anticoagulants: Secondary | ICD-10-CM

## 2020-11-07 DIAGNOSIS — E440 Moderate protein-calorie malnutrition: Secondary | ICD-10-CM | POA: Diagnosis present

## 2020-11-07 DIAGNOSIS — F0150 Vascular dementia without behavioral disturbance: Secondary | ICD-10-CM

## 2020-11-07 DIAGNOSIS — I129 Hypertensive chronic kidney disease with stage 1 through stage 4 chronic kidney disease, or unspecified chronic kidney disease: Secondary | ICD-10-CM | POA: Diagnosis present

## 2020-11-07 DIAGNOSIS — H547 Unspecified visual loss: Secondary | ICD-10-CM | POA: Diagnosis present

## 2020-11-07 DIAGNOSIS — I6310 Cerebral infarction due to embolism of unspecified precerebral artery: Secondary | ICD-10-CM | POA: Diagnosis not present

## 2020-11-07 DIAGNOSIS — N179 Acute kidney failure, unspecified: Secondary | ICD-10-CM | POA: Diagnosis present

## 2020-11-07 DIAGNOSIS — R1310 Dysphagia, unspecified: Secondary | ICD-10-CM | POA: Diagnosis present

## 2020-11-07 DIAGNOSIS — Z20822 Contact with and (suspected) exposure to covid-19: Secondary | ICD-10-CM | POA: Diagnosis present

## 2020-11-07 DIAGNOSIS — E875 Hyperkalemia: Secondary | ICD-10-CM | POA: Diagnosis present

## 2020-11-07 DIAGNOSIS — Z6825 Body mass index (BMI) 25.0-25.9, adult: Secondary | ICD-10-CM

## 2020-11-07 DIAGNOSIS — E119 Type 2 diabetes mellitus without complications: Secondary | ICD-10-CM

## 2020-11-07 DIAGNOSIS — I63411 Cerebral infarction due to embolism of right middle cerebral artery: Secondary | ICD-10-CM | POA: Diagnosis not present

## 2020-11-07 DIAGNOSIS — E8809 Other disorders of plasma-protein metabolism, not elsewhere classified: Secondary | ICD-10-CM | POA: Diagnosis present

## 2020-11-07 DIAGNOSIS — E785 Hyperlipidemia, unspecified: Secondary | ICD-10-CM | POA: Diagnosis present

## 2020-11-07 DIAGNOSIS — Z79899 Other long term (current) drug therapy: Secondary | ICD-10-CM

## 2020-11-07 DIAGNOSIS — Z515 Encounter for palliative care: Secondary | ICD-10-CM

## 2020-11-07 DIAGNOSIS — Z7984 Long term (current) use of oral hypoglycemic drugs: Secondary | ICD-10-CM

## 2020-11-07 DIAGNOSIS — F01511 Vascular dementia, unspecified severity, with agitation: Secondary | ICD-10-CM | POA: Diagnosis not present

## 2020-11-07 DIAGNOSIS — I69398 Other sequelae of cerebral infarction: Secondary | ICD-10-CM

## 2020-11-07 DIAGNOSIS — E872 Acidosis, unspecified: Secondary | ICD-10-CM | POA: Diagnosis present

## 2020-11-07 DIAGNOSIS — D638 Anemia in other chronic diseases classified elsewhere: Secondary | ICD-10-CM | POA: Diagnosis present

## 2020-11-07 DIAGNOSIS — R29716 NIHSS score 16: Secondary | ICD-10-CM | POA: Diagnosis present

## 2020-11-07 DIAGNOSIS — I959 Hypotension, unspecified: Secondary | ICD-10-CM | POA: Diagnosis present

## 2020-11-07 DIAGNOSIS — Z794 Long term (current) use of insulin: Secondary | ICD-10-CM

## 2020-11-07 DIAGNOSIS — I6930 Unspecified sequelae of cerebral infarction: Secondary | ICD-10-CM

## 2020-11-07 DIAGNOSIS — E860 Dehydration: Secondary | ICD-10-CM | POA: Diagnosis present

## 2020-11-07 DIAGNOSIS — R4781 Slurred speech: Secondary | ICD-10-CM | POA: Diagnosis present

## 2020-11-07 DIAGNOSIS — I480 Paroxysmal atrial fibrillation: Secondary | ICD-10-CM | POA: Diagnosis present

## 2020-11-07 DIAGNOSIS — F01518 Vascular dementia, unspecified severity, with other behavioral disturbance: Secondary | ICD-10-CM | POA: Diagnosis not present

## 2020-11-07 LAB — I-STAT CHEM 8, ED
BUN: 40 mg/dL — ABNORMAL HIGH (ref 8–23)
Calcium, Ion: 1.09 mmol/L — ABNORMAL LOW (ref 1.15–1.40)
Chloride: 106 mmol/L (ref 98–111)
Creatinine, Ser: 3.6 mg/dL — ABNORMAL HIGH (ref 0.61–1.24)
Glucose, Bld: 150 mg/dL — ABNORMAL HIGH (ref 70–99)
HCT: 42 % (ref 39.0–52.0)
Hemoglobin: 14.3 g/dL (ref 13.0–17.0)
Potassium: 5.3 mmol/L — ABNORMAL HIGH (ref 3.5–5.1)
Sodium: 141 mmol/L (ref 135–145)
TCO2: 27 mmol/L (ref 22–32)

## 2020-11-07 LAB — CBC
HCT: 43.6 % (ref 39.0–52.0)
Hemoglobin: 13.2 g/dL (ref 13.0–17.0)
MCH: 29.5 pg (ref 26.0–34.0)
MCHC: 30.3 g/dL (ref 30.0–36.0)
MCV: 97.3 fL (ref 80.0–100.0)
Platelets: 253 10*3/uL (ref 150–400)
RBC: 4.48 MIL/uL (ref 4.22–5.81)
RDW: 14.6 % (ref 11.5–15.5)
WBC: 7.7 10*3/uL (ref 4.0–10.5)
nRBC: 0 % (ref 0.0–0.2)

## 2020-11-07 LAB — DIFFERENTIAL
Abs Immature Granulocytes: 0.03 10*3/uL (ref 0.00–0.07)
Basophils Absolute: 0 10*3/uL (ref 0.0–0.1)
Basophils Relative: 0 %
Eosinophils Absolute: 0 10*3/uL (ref 0.0–0.5)
Eosinophils Relative: 1 %
Immature Granulocytes: 0 %
Lymphocytes Relative: 15 %
Lymphs Abs: 1.1 10*3/uL (ref 0.7–4.0)
Monocytes Absolute: 0.7 10*3/uL (ref 0.1–1.0)
Monocytes Relative: 9 %
Neutro Abs: 5.8 10*3/uL (ref 1.7–7.7)
Neutrophils Relative %: 75 %

## 2020-11-07 LAB — APTT: aPTT: 45 seconds — ABNORMAL HIGH (ref 24–36)

## 2020-11-07 LAB — CBG MONITORING, ED: Glucose-Capillary: 151 mg/dL — ABNORMAL HIGH (ref 70–99)

## 2020-11-07 LAB — COMPREHENSIVE METABOLIC PANEL
ALT: 12 U/L (ref 0–44)
AST: 20 U/L (ref 15–41)
Albumin: 3.4 g/dL — ABNORMAL LOW (ref 3.5–5.0)
Alkaline Phosphatase: 102 U/L (ref 38–126)
Anion gap: 12 (ref 5–15)
BUN: 39 mg/dL — ABNORMAL HIGH (ref 8–23)
CO2: 23 mmol/L (ref 22–32)
Calcium: 9 mg/dL (ref 8.9–10.3)
Chloride: 104 mmol/L (ref 98–111)
Creatinine, Ser: 3.33 mg/dL — ABNORMAL HIGH (ref 0.61–1.24)
GFR, Estimated: 18 mL/min — ABNORMAL LOW (ref >60)
Glucose, Bld: 150 mg/dL — ABNORMAL HIGH (ref 70–99)
Potassium: 5.5 mmol/L — ABNORMAL HIGH (ref 3.5–5.1)
Sodium: 139 mmol/L (ref 135–145)
Total Bilirubin: 0.5 mg/dL (ref 0.3–1.2)
Total Protein: 7.1 g/dL (ref 6.5–8.1)

## 2020-11-07 LAB — PROTIME-INR
INR: 1.7 — ABNORMAL HIGH (ref 0.8–1.2)
Prothrombin Time: 20.3 seconds — ABNORMAL HIGH (ref 11.4–15.2)

## 2020-11-07 MED ORDER — SODIUM CHLORIDE 0.9% FLUSH: 3.0000 mL | Freq: Once | INTRAVENOUS | Status: DC

## 2020-11-07 MED ORDER — SODIUM CHLORIDE 0.9 % IV BOLUS
1000.0000 mL | Freq: Once | INTRAVENOUS | Status: AC
  Administered 2020-11-08: 1000 mL via INTRAVENOUS

## 2020-11-07 MED ORDER — SODIUM CHLORIDE 0.9 % IV SOLN: INTRAVENOUS | Status: DC

## 2020-11-07 NOTE — ED Provider Notes (Signed)
[facility] EMERGENCY DEPARTMENT Provider Note   CSN: [id number] Arrival date & time: 11/07/20  2218  An emergency department physician performed an initial assessment on this suspected stroke patient at 2220.  History No chief complaint on file.   Randall [name] is a 75 y.o. male.  Patient brought in from [facility].  Apparently last known normal 1800 patient developed slurred speech and left arm weakness.  Patient was admitted August 30 for an acute infarct.  Patient is known to have vascular dementia with some behavioral problems.  Initial blood pressures were like 80 but then repeat blood pressures here have all been in the low 100s.  Patient did appear to be a little dry.  Patient receiving some fluids.  There appeared when he arrived to slurred speech seem to be resolved and there seem to be still may be a hint of some left arm weakness.  We will that know him says that his mental status is kind of baseline he will at times have outbursts he is not doing any that now.  When asked if patient has any pain he says no and it seems very clear does not seem to have any slurred speech currently      Past Medical History:  Diagnosis Date   Atrial fibrillation (HCC)    CVA (cerebral vascular accident) ([place])    DM (diabetes mellitus) ([place])    HTN (hypertension)     Patient Active Problem List   Diagnosis Date Noted   Stroke (cerebrum) ([place]) 09/19/2020   Acute CVA (cerebrovascular accident) ([place]) 09/17/2020   ARF (acute renal failure) ([place]) 09/17/2020   Normocytic anemia 09/17/2020    No past surgical history on file.     Family History  Family history unknown: Yes    Social History   Tobacco Use   Smokeless tobacco: Never  Substance Use Topics   Alcohol use: Never    Home Medications Prior to Admission medications   Medication Sig Start Date End Date Taking? Authorizing Provider  acetaminophen (TYLENOL) 325 MG tablet Take 650 mg by mouth every 6  (six) hours as needed (for pain).    [provider]  acetaminophen (TYLENOL) 325 MG tablet Take 650 mg by mouth every 6 (six) hours as needed (pain).    [provider]  apixaban (ELIQUIS) 5 MG TABS tablet Take 1 tablet (5 mg total) by mouth in the morning and at bedtime. 09/19/20   Caren Griffins, MD  apixaban (ELIQUIS) 5 MG TABS tablet Take 5 mg by mouth 2 (two) times daily.    [provider]  aspirin-sod bicarb-citric acid (ALKA-SELTZER ORIGINAL) 325 MG TBEF tablet Take 650 mg by mouth daily as needed (heartburn).    [provider]  atorvastatin (LIPITOR) 40 MG tablet Take 2 tablets (80 mg total) by mouth at bedtime. 09/19/20   Caren Griffins, MD  atorvastatin (LIPITOR) 40 MG tablet Take 80 mg by mouth at bedtime.    [provider]  blood glucose meter kit and supplies Dispense based on patient and insurance preference. Use up to four times daily as directed. (FOR ICD-10 E10.9, E11.9). 09/19/20   Caren Griffins, MD  carvedilol (COREG) 6.25 MG tablet Take 1 tablet (6.25 mg total) by mouth 2 (two) times daily with a meal. 09/19/20   Gherghe, Vella Redhead, MD  carvedilol (COREG) 6.25 MG tablet Take 6.25 mg by mouth 2 (two) times daily.    [provider]  dapagliflozin propanediol (  FARXIGA) 10 MG TABS tablet Take 10 mg by mouth every morning.    [provider]  dicyclomine (BENTYL) 10 MG capsule Take 1 capsule (10 mg total) by mouth 4 (four) times daily -  before meals and at bedtime. 09/19/20   Caren Griffins, MD  dicyclomine (BENTYL) 10 MG capsule Take 10 mg by mouth 4 (four) times daily.    [provider]  docusate sodium (COLACE) 100 MG capsule Take 1 capsule (100 mg total) by mouth in the morning and at bedtime. 09/19/20   Caren Griffins, MD  docusate sodium (COLACE) 100 MG capsule Take 100 mg by mouth 2 (two) times daily.    [provider]  glipiZIDE (GLUCOTROL) 5 MG tablet Take 1 tablet (5 mg total) by mouth  2 (two) times daily before a meal. 09/19/20   Gherghe, Vella Redhead, MD  glipiZIDE (GLUCOTROL) 5 MG tablet Take 5 mg by mouth 2 (two) times daily.    [provider]  haloperidol (HALDOL) 0.5 MG tablet Take 0.5 mg by mouth every morning.    [provider]  hydrALAZINE (APRESOLINE) 50 MG tablet Take 2 tablets (100 mg total) by mouth 3 (three) times daily. 09/19/20   Caren Griffins, MD  hydrALAZINE (APRESOLINE) 50 MG tablet Take 100 mg by mouth 3 (three) times daily.    [provider]  insulin glargine (LANTUS) 100 UNIT/ML injection Inject 8 Units into the skin every morning.    [provider]  insulin glargine, 1 Unit Dial, (TOUJEO) 300 UNIT/ML Solostar Pen Inject 5 Units into the skin daily. 09/19/20   Caren Griffins, MD  Insulin Syringe-Needle U-100 (INSULIN SYRINGE .5CC/30GX1/2") 30G X 1/2" 0.5 ML MISC 1 each by Does not apply route daily. 09/19/20   Caren Griffins, MD  lipase/protease/amylase (CREON) 12000-38000 units CPEP capsule Take 1 capsule (12,000 Units total) by mouth 4 (four) times daily. 09/19/20   Caren Griffins, MD  lipase/protease/amylase (CREON) 12000-38000 units CPEP capsule Take 12,000 Units by mouth 4 (four) times daily.    [provider]  meclizine (ANTIVERT) 25 MG tablet Take 1 tablet (25 mg total) by mouth every 8 (eight) hours as needed for dizziness. 09/19/20   Caren Griffins, MD  meclizine (ANTIVERT) 25 MG tablet Take 25 mg by mouth every 8 (eight) hours as needed for dizziness.    [provider]  mirtazapine (REMERON) 7.5 MG tablet Take 7.5 mg by mouth at bedtime.    [provider]  ondansetron (ZOFRAN) 4 MG tablet Take 1 tablet (4 mg total) by mouth every 8 (eight) hours as needed for nausea or vomiting. 09/19/20   Caren Griffins, MD  ondansetron (ZOFRAN) 4 MG tablet Take 4 mg by mouth See admin instructions. Take one tablet (4 mg) by mouth three times daily, may also take one tablet (4 mg) every 8 hours as  needed for nausea/vomiting - max 3 tablets/24 hours    [provider]  pantoprazole (PROTONIX) 40 MG tablet Take 1 tablet (40 mg total) by mouth 2 (two) times daily before a meal. 09/19/20   Gherghe, Vella Redhead, MD  pantoprazole (PROTONIX) 40 MG tablet Take 40 mg by mouth 2 (two) times daily.    [provider]  polyethylene glycol (MIRALAX / GLYCOLAX) 17 g packet Take 17 g by mouth daily as needed (constipation).    [provider]  polyethylene glycol powder (GLYCOLAX/MIRALAX) 17 GM/SCOOP powder Take 17 g by mouth daily as  needed for moderate constipation. 09/19/20   Caren Griffins, MD  sertraline (ZOLOFT) 25 MG tablet Take 25 mg by mouth every morning.    [provider]  valsartan (DIOVAN) 320 MG tablet Take 1 tablet (320 mg total) by mouth daily. 09/19/20   Caren Griffins, MD  valsartan (DIOVAN) 320 MG tablet Take 320 mg by mouth every morning.    [provider]  vitamin B-12 (CYANOCOBALAMIN) 1000 MCG tablet Take 1 tablet (1,000 mcg total) by mouth daily. 09/19/20   Caren Griffins, MD  vitamin B-12 (CYANOCOBALAMIN) 1000 MCG tablet Take 1,000 mcg by mouth every morning.    [provider]  zolpidem (AMBIEN) 5 MG tablet Take 5 mg by mouth at bedtime.    [provider]    Allergies    Patient has no known allergies.  Review of Systems   Review of Systems  Unable to perform ROS: Dementia   Physical Exam Updated Vital Signs BP 107/64 (BP Location: Right Arm)   Pulse 81   Temp (!) 97.2 F (36.2 C) (Temporal)   Resp 20   Wt 81.6 kg   SpO2 98%   BMI 25.81 kg/m   Physical Exam Vitals and nursing note reviewed.  Constitutional:      Appearance: Normal appearance. He is well-developed.  HENT:     Head: Normocephalic and atraumatic.     Mouth/Throat:     Mouth: Mucous membranes are dry.  Eyes:     Extraocular Movements: Extraocular movements intact.     Conjunctiva/sclera: Conjunctivae normal.     Pupils: Pupils are  equal, round, and reactive to light.  Cardiovascular:     Rate and Rhythm: Normal rate and regular rhythm.     Heart sounds: No murmur heard. Pulmonary:     Effort: Pulmonary effort is normal. No respiratory distress.     Breath sounds: Normal breath sounds.  Abdominal:     Palpations: Abdomen is soft.     Tenderness: There is no abdominal tenderness.  Musculoskeletal:     Cervical back: Neck supple.  Skin:    General: Skin is warm and dry.  Neurological:     Mental Status: He is alert. Mental status is at baseline.     Cranial Nerves: No cranial nerve deficit.     Motor: Weakness present.     Comments: Seems to be a little bit of left upper extremity weakness.  But patient is moving all 4 extremities    ED Results / Procedures / Treatments   Labs (all labs ordered are listed, but only abnormal results are displayed) Labs Reviewed  I-STAT CHEM 8, ED - Abnormal; Notable for the following components:      Result Value   Potassium 5.3 (*)    BUN 40 (*)    Creatinine, Ser 3.60 (*)    Glucose, Bld 150 (*)    Calcium, Ion 1.09 (*)    All other components within normal limits  CBG MONITORING, ED - Abnormal; Notable for the following components:   Glucose-Capillary 151 (*)    All other components within normal limits  PROTIME-INR  APTT  CBC  DIFFERENTIAL  COMPREHENSIVE METABOLIC PANEL    EKG None  Radiology CT HEAD CODE STROKE WO CONTRAST  Result Date: 11/07/2020 CLINICAL DATA:  Code stroke. EXAM: CT HEAD WITHOUT CONTRAST TECHNIQUE: Contiguous axial images were obtained from the base of the skull through the vertex without intravenous contrast. COMPARISON:  10/26/2020. FINDINGS: Brain: New hypodensities in  the right posterior limb of the internal capsule (series 3, image 17) and right corona radiata (series 3, image 20), of indeterminate acuity. Redemonstrated hypodensity in the left PCA territory, right ACA territory, left medulla, and left cerebellum. Periventricular white  matter changes, likely the sequela of chronic small vessel ischemic disease. Vascular: No hyperdense vessel. Skull: Normal. Negative for fracture or focal lesion. Sinuses/Orbits: No acute finding. Other: The mastoids are well aerated. ASPECTS [facility] Stroke Program Early CT Score) - Ganglionic level infarction (caudate, lentiform nuclei, internal capsule, insula, M1-M3 cortex): 6 - Supraganglionic infarction (M4-M6 cortex): 3 Total score (0-10 with 10 being normal): 9 IMPRESSION: 1. New hypodensities in the right posterior limb of the internal capsule and right corona radiata, likely acute or subacute infarcts. No hemorrhage. 2. ASPECTS is 9. Code stroke imaging results were communicated on 11/07/2020 at 10:39 pm to provider [facility] via secure text paging. Electronically Signed   By: Merilyn Baba M.D.   On: 11/07/2020 22:42    Procedures Procedures   Medications Ordered in ED Medications  sodium chloride flush (NS) 0.9 % injection 3 mL (has no administration in time range)  0.9 %  sodium chloride infusion (has no administration in time range)  sodium chloride 0.9 % bolus 1,000 mL (has no administration in time range)    ED Course  I have reviewed the triage vital signs and the nursing notes.  Pertinent labs & imaging results that were available during my care of the patient were reviewed by me and considered in my medical decision making (see chart for details).    MDM Rules/Calculators/A&P                         CRITICAL CARE Performed by: Fredia Sorrow Total critical care time: 60 minutes Critical care time was exclusive of separately billable procedures and treating other patients. Critical care was necessary to treat or prevent imminent or life-threatening deterioration. Critical care was time spent personally by me on the following activities: development of treatment plan with patient and/or surrogate as well as nursing, discussions with consultants, evaluation of patient's  response to treatment, examination of patient, obtaining history from patient or surrogate, ordering and performing treatments and interventions, ordering and review of laboratory studies, ordering and review of radiographic studies, pulse oximetry and re-evaluation of patient's condition.  Arrived as a code stroke.  Seen by the stroke team as well as Dr. Theda Sers.  Patient's blood pressures were fine he was not hypotensive the head CT showed new hypodensities he in the right posterior limb of the internal capsule and the right corona radiata likely acute or subacute infarcts.  No hemorrhage per Dr. Theda Sers felt that he had some evidence of probably some acute infarct in the face of the old infarct area.  He is obviously recommending admission.  Also our lab work-up shows that patient is in acute kidney injury.  Potassium is 5.3 sotalol elevated but not in the danger area.  Significant change in his creatinine to 3.60.  From previous.  Patient receiving a liter of fluid here and then IV maintenance 75 cc an hour because he seemed dry.  We had some concerns about the blood pressure.  Glucose was good at 150.  Rest of the stroke labs are still pending.  Discussed with the hospitalist who will admit.  Some discussion about whether this patient should because of all his falls should be on the Xarelto that he is on.  And  also will need hospitalist admission for stroke work-up.  Final Clinical Impression(s) / ED Diagnoses Final diagnoses:  Cerebrovascular accident (CVA), unspecified mechanism [facility])    Rx / [place] Orders ED Discharge Orders     None        Fredia Sorrow, MD 11/07/20 2336

## 2020-11-07 NOTE — Consult Note (Addendum)
Neurology Consult H&P  Randall [name] MR# [id number] 11/07/2020   CC: left sided weakness  History is obtained from: EMS and chart.  HPI: Randall [name] is a 75 y.o. male resides at nursing home, is dependent PMHx as reviewed below previous stroke, vascular dementia, atrial fibrillation on apixaban previously admitted August 30 for acute stroke developed left upper extremity weakness noticed around 1830.  LKW: 0539 tNK given: No apixaban, recent stroke less than 3 months ago. IR Thrombectomy not likely LVO Modified Rankin Scale: 4-Needs assistance to walk and tend to bodily needs NIHSS: 16  ROS: A complete ROS was performed and is negative except as noted in the HPI.   Past Medical History:  Diagnosis Date   Atrial fibrillation ([place])    CVA (cerebral vascular accident) ([place])    DM (diabetes mellitus) ([place])    HTN (hypertension)      Family History  Family history unknown: Yes    Social History:  reports that he does not have a smoking history on file. He has never used smokeless tobacco. He reports that he does not drink alcohol. No history on file for drug use.   Prior to Admission medications   Medication Sig Start Date End Date Taking? Authorizing Provider  acetaminophen (TYLENOL) 325 MG tablet Take 650 mg by mouth every 6 (six) hours as needed (for pain).    [provider]  acetaminophen (TYLENOL) 325 MG tablet Take 650 mg by mouth every 6 (six) hours as needed (pain).    [provider]  apixaban (ELIQUIS) 5 MG TABS tablet Take 1 tablet (5 mg total) by mouth in the morning and at bedtime. 09/19/20   Caren Griffins, MD  apixaban (ELIQUIS) 5 MG TABS tablet Take 5 mg by mouth 2 (two) times daily.    [provider]  aspirin-sod bicarb-citric acid (ALKA-SELTZER ORIGINAL) 325 MG TBEF tablet Take 650 mg by mouth daily as needed (heartburn).    [provider]  atorvastatin (LIPITOR) 40 MG tablet Take 2 tablets (80 mg total) by mouth at  bedtime. 09/19/20   Caren Griffins, MD  atorvastatin (LIPITOR) 40 MG tablet Take 80 mg by mouth at bedtime.    [provider]  blood glucose meter kit and supplies Dispense based on patient and insurance preference. Use up to four times daily as directed. (FOR ICD-10 E10.9, E11.9). 09/19/20   Caren Griffins, MD  carvedilol (COREG) 6.25 MG tablet Take 1 tablet (6.25 mg total) by mouth 2 (two) times daily with a meal. 09/19/20   Gherghe, Vella Redhead, MD  carvedilol (COREG) 6.25 MG tablet Take 6.25 mg by mouth 2 (two) times daily.    [provider]  dapagliflozin propanediol (FARXIGA) 10 MG TABS tablet Take 10 mg by mouth every morning.    [provider]  dicyclomine (BENTYL) 10 MG capsule Take 1 capsule (10 mg total) by mouth 4 (four) times daily -  before meals and at bedtime. 09/19/20   Caren Griffins, MD  dicyclomine (BENTYL) 10 MG capsule Take 10 mg by mouth 4 (four) times daily.    [provider]  docusate sodium (COLACE) 100 MG capsule Take 1 capsule (100 mg total) by mouth in the morning and at bedtime. 09/19/20   Caren Griffins, MD  docusate sodium (COLACE) 100 MG capsule Take 100 mg by mouth 2 (two) times daily.    [provider]  glipiZIDE (GLUCOTROL) 5 MG tablet Take 1 tablet (5 mg total) by  mouth 2 (two) times daily before a meal. 09/19/20   Gherghe, Vella Redhead, MD  glipiZIDE (GLUCOTROL) 5 MG tablet Take 5 mg by mouth 2 (two) times daily.    [provider]  haloperidol (HALDOL) 0.5 MG tablet Take 0.5 mg by mouth every morning.    [provider]  hydrALAZINE (APRESOLINE) 50 MG tablet Take 2 tablets (100 mg total) by mouth 3 (three) times daily. 09/19/20   Caren Griffins, MD  hydrALAZINE (APRESOLINE) 50 MG tablet Take 100 mg by mouth 3 (three) times daily.    [provider]  insulin glargine (LANTUS) 100 UNIT/ML injection Inject 8 Units into the skin every morning.    [provider]  insulin glargine, 1 Unit  Dial, (TOUJEO) 300 UNIT/ML Solostar Pen Inject 5 Units into the skin daily. 09/19/20   Caren Griffins, MD  Insulin Syringe-Needle U-100 (INSULIN SYRINGE .5CC/30GX1/2") 30G X 1/2" 0.5 ML MISC 1 each by Does not apply route daily. 09/19/20   Caren Griffins, MD  lipase/protease/amylase (CREON) 12000-38000 units CPEP capsule Take 1 capsule (12,000 Units total) by mouth 4 (four) times daily. 09/19/20   Caren Griffins, MD  lipase/protease/amylase (CREON) 12000-38000 units CPEP capsule Take 12,000 Units by mouth 4 (four) times daily.    [provider]  meclizine (ANTIVERT) 25 MG tablet Take 1 tablet (25 mg total) by mouth every 8 (eight) hours as needed for dizziness. 09/19/20   Caren Griffins, MD  meclizine (ANTIVERT) 25 MG tablet Take 25 mg by mouth every 8 (eight) hours as needed for dizziness.    [provider]  mirtazapine (REMERON) 7.5 MG tablet Take 7.5 mg by mouth at bedtime.    [provider]  ondansetron (ZOFRAN) 4 MG tablet Take 1 tablet (4 mg total) by mouth every 8 (eight) hours as needed for nausea or vomiting. 09/19/20   Caren Griffins, MD  ondansetron (ZOFRAN) 4 MG tablet Take 4 mg by mouth See admin instructions. Take one tablet (4 mg) by mouth three times daily, may also take one tablet (4 mg) every 8 hours as needed for nausea/vomiting - max 3 tablets/24 hours    [provider]  pantoprazole (PROTONIX) 40 MG tablet Take 1 tablet (40 mg total) by mouth 2 (two) times daily before a meal. 09/19/20   Gherghe, Vella Redhead, MD  pantoprazole (PROTONIX) 40 MG tablet Take 40 mg by mouth 2 (two) times daily.    [provider]  polyethylene glycol (MIRALAX / GLYCOLAX) 17 g packet Take 17 g by mouth daily as needed (constipation).    [provider]  polyethylene glycol powder (GLYCOLAX/MIRALAX) 17 GM/SCOOP powder Take 17 g by mouth daily as needed for moderate constipation. 09/19/20   Caren Griffins, MD  sertraline (ZOLOFT) 25 MG tablet Take  25 mg by mouth every morning.    [provider]  valsartan (DIOVAN) 320 MG tablet Take 1 tablet (320 mg total) by mouth daily. 09/19/20   Caren Griffins, MD  valsartan (DIOVAN) 320 MG tablet Take 320 mg by mouth every morning.    [provider]  vitamin B-12 (CYANOCOBALAMIN) 1000 MCG tablet Take 1 tablet (1,000 mcg total) by mouth daily. 09/19/20   Caren Griffins, MD  vitamin B-12 (CYANOCOBALAMIN) 1000 MCG tablet Take 1,000 mcg by mouth every morning.    [provider]  zolpidem (AMBIEN) 5 MG tablet Take 5 mg by mouth at bedtime.    [provider]  Exam: Current vital signs: BP 107/64 (BP Location: Right Arm)   Pulse 81   Temp (!) 97.2 F (36.2 C) (Temporal)   Resp 20   SpO2 98%   Physical Exam  Constitutional: Appears well-developed and well-nourished.  Psych: Affect appropriate to situation Eyes: No scleral injection HENT: No OP obstruction. Head: Normocephalic.  Cardiovascular: Normal rate and regular rhythm.  Respiratory: Effort normal, symmetric excursions bilaterally, no audible wheezing. GI: Soft.  No distension. There is no tenderness.  Skin: WDI  Neuro: Mental Status: Patient is awake, alert, oriented to person, place. Patient is unable to give a clear and coherent history. Speech fluent, intact comprehension and repetition. No signs of aphasia. Mild dysarthria however he is edentulous. Visual Fields right hemianopsia. Pupils are equal, round, and reactive to light. EOMI with head turn without ptosis.  Facial sensation is symmetric to temperature Facial movement is left asymmetric.  Hearing is intact to voice. Uvula midline and palate elevates symmetrically. Shoulder shrug is symmetric. Tongue is midline without atrophy or fasciculations.  Tone is normal. Bulk is normal.  Left upper extremity flexion 4/5; extension 3/5, left lower extremity internally rotated and flexed at the hip and flexed at the knee. Sensation is  diminished on the left to light touch. Deep Tendon Reflexes: 2+ and symmetric in the biceps and patellae. Babinski positive left  FNF and HKS unable to perform. Gait - Deferred  I have reviewed labs in epic and the pertinent results are: K5.3, BUN 40, creatinine 3.6, ionized calcium 1.09  I have reviewed the images obtained: NCT head showed New hypodensities in the right posterior limb of the internal capsule and right corona radiata, likely acute or subacute infarcts. No hemorrhage. ASPECTS is 9.  Assessment: Aroldo Galli is a 75 y.o. male PMHx previous history of strokes/left PCA occlusion with residual visual loss now with acute left upper extremity weakness.  The patient has atrial fibrillation and therefore etiology of acute stroke is embolic.  He is on apixaban with recent stroke 09/17/2020 and is therefore not a candidate for thrombolysis.  Impression:  Acute embolic stroke Chronic embolic strokes AKI Hyperkalemia Hypocalcemia  Plan: - MRI brain without contrast. - continue apixaban. - SBP goal <160. - Telemetry monitoring for arrhythmia. - Recommend bedside Swallow screen. - Recommend Stroke education. - Recommend PT/OT/SLP consult. -Continue to correct metabolic derangements  This patient is critically ill and at significant risk of neurological worsening, death and care requires constant monitoring of vital signs, hemodynamics,respiratory and cardiac monitoring, neurological assessment, discussion with family, other specialists and medical decision making of high complexity. I spent 73 minutes of neurocritical care time  in the care of  this patient. This was time spent independent of any time provided by nurse practitioner or PA.  Electronically signed by:  Lynnae Sandhoff, MD Page: [phone] 11/07/2020, 10:28 PM

## 2020-11-07 NOTE — Code Documentation (Signed)
Responded to Code Stroke called at 2203 for L arm flaccidity, [id number]. Pt arrived at 2218, CBG-151, NIH-16, CTH-new hypodensities in R post limb of the internal capsule and R corona radiata, likely acute or subacute infarcts, no hemorrhage. TNK not given-pt on eliquis. Plan to admit to medical team and work up for stroke.

## 2020-11-08 ENCOUNTER — Observation Stay (HOSPITAL_COMMUNITY): Payer: Medicare Other

## 2020-11-08 ENCOUNTER — Other Ambulatory Visit (HOSPITAL_COMMUNITY): Payer: Medicare Other

## 2020-11-08 DIAGNOSIS — Z20822 Contact with and (suspected) exposure to covid-19: Secondary | ICD-10-CM | POA: Diagnosis present

## 2020-11-08 DIAGNOSIS — R4781 Slurred speech: Secondary | ICD-10-CM | POA: Diagnosis present

## 2020-11-08 DIAGNOSIS — Z8673 Personal history of transient ischemic attack (TIA), and cerebral infarction without residual deficits: Secondary | ICD-10-CM

## 2020-11-08 DIAGNOSIS — E872 Acidosis, unspecified: Secondary | ICD-10-CM | POA: Diagnosis present

## 2020-11-08 DIAGNOSIS — E860 Dehydration: Secondary | ICD-10-CM | POA: Diagnosis present

## 2020-11-08 DIAGNOSIS — H547 Unspecified visual loss: Secondary | ICD-10-CM | POA: Diagnosis present

## 2020-11-08 DIAGNOSIS — I6389 Other cerebral infarction: Secondary | ICD-10-CM

## 2020-11-08 DIAGNOSIS — I639 Cerebral infarction, unspecified: Secondary | ICD-10-CM | POA: Diagnosis present

## 2020-11-08 DIAGNOSIS — I63512 Cerebral infarction due to unspecified occlusion or stenosis of left middle cerebral artery: Secondary | ICD-10-CM | POA: Diagnosis not present

## 2020-11-08 DIAGNOSIS — R627 Adult failure to thrive: Secondary | ICD-10-CM | POA: Diagnosis not present

## 2020-11-08 DIAGNOSIS — I480 Paroxysmal atrial fibrillation: Secondary | ICD-10-CM | POA: Diagnosis present

## 2020-11-08 DIAGNOSIS — R4182 Altered mental status, unspecified: Secondary | ICD-10-CM | POA: Diagnosis not present

## 2020-11-08 DIAGNOSIS — E119 Type 2 diabetes mellitus without complications: Secondary | ICD-10-CM

## 2020-11-08 DIAGNOSIS — N179 Acute kidney failure, unspecified: Secondary | ICD-10-CM | POA: Diagnosis present

## 2020-11-08 DIAGNOSIS — E875 Hyperkalemia: Secondary | ICD-10-CM | POA: Diagnosis present

## 2020-11-08 DIAGNOSIS — E1122 Type 2 diabetes mellitus with diabetic chronic kidney disease: Secondary | ICD-10-CM | POA: Diagnosis present

## 2020-11-08 DIAGNOSIS — G8321 Monoplegia of upper limb affecting right dominant side: Secondary | ICD-10-CM | POA: Diagnosis present

## 2020-11-08 DIAGNOSIS — I959 Hypotension, unspecified: Secondary | ICD-10-CM | POA: Diagnosis present

## 2020-11-08 DIAGNOSIS — I129 Hypertensive chronic kidney disease with stage 1 through stage 4 chronic kidney disease, or unspecified chronic kidney disease: Secondary | ICD-10-CM | POA: Diagnosis present

## 2020-11-08 DIAGNOSIS — D638 Anemia in other chronic diseases classified elsewhere: Secondary | ICD-10-CM | POA: Diagnosis present

## 2020-11-08 DIAGNOSIS — F01518 Vascular dementia, unspecified severity, with other behavioral disturbance: Secondary | ICD-10-CM | POA: Diagnosis not present

## 2020-11-08 DIAGNOSIS — E440 Moderate protein-calorie malnutrition: Secondary | ICD-10-CM | POA: Diagnosis present

## 2020-11-08 DIAGNOSIS — N182 Chronic kidney disease, stage 2 (mild): Secondary | ICD-10-CM | POA: Diagnosis present

## 2020-11-08 DIAGNOSIS — E785 Hyperlipidemia, unspecified: Secondary | ICD-10-CM | POA: Diagnosis present

## 2020-11-08 DIAGNOSIS — I69398 Other sequelae of cerebral infarction: Secondary | ICD-10-CM | POA: Diagnosis not present

## 2020-11-08 DIAGNOSIS — Z515 Encounter for palliative care: Secondary | ICD-10-CM | POA: Diagnosis not present

## 2020-11-08 DIAGNOSIS — Z66 Do not resuscitate: Secondary | ICD-10-CM | POA: Diagnosis present

## 2020-11-08 DIAGNOSIS — R1310 Dysphagia, unspecified: Secondary | ICD-10-CM | POA: Diagnosis present

## 2020-11-08 DIAGNOSIS — E8809 Other disorders of plasma-protein metabolism, not elsewhere classified: Secondary | ICD-10-CM | POA: Diagnosis present

## 2020-11-08 DIAGNOSIS — I63411 Cerebral infarction due to embolism of right middle cerebral artery: Secondary | ICD-10-CM | POA: Diagnosis present

## 2020-11-08 DIAGNOSIS — F0150 Vascular dementia without behavioral disturbance: Secondary | ICD-10-CM

## 2020-11-08 DIAGNOSIS — F01511 Vascular dementia, unspecified severity, with agitation: Secondary | ICD-10-CM | POA: Diagnosis not present

## 2020-11-08 DIAGNOSIS — I63511 Cerebral infarction due to unspecified occlusion or stenosis of right middle cerebral artery: Secondary | ICD-10-CM | POA: Diagnosis not present

## 2020-11-08 DIAGNOSIS — R52 Pain, unspecified: Secondary | ICD-10-CM | POA: Diagnosis not present

## 2020-11-08 LAB — COMPREHENSIVE METABOLIC PANEL
ALT: 10 U/L (ref 0–44)
AST: 20 U/L (ref 15–41)
Albumin: 2.9 g/dL — ABNORMAL LOW (ref 3.5–5.0)
Alkaline Phosphatase: 87 U/L (ref 38–126)
Anion gap: 10 (ref 5–15)
BUN: 38 mg/dL — ABNORMAL HIGH (ref 8–23)
CO2: 22 mmol/L (ref 22–32)
Calcium: 8.4 mg/dL — ABNORMAL LOW (ref 8.9–10.3)
Chloride: 108 mmol/L (ref 98–111)
Creatinine, Ser: 3.03 mg/dL — ABNORMAL HIGH (ref 0.61–1.24)
GFR, Estimated: 20 mL/min — ABNORMAL LOW (ref >60)
Glucose, Bld: 102 mg/dL — ABNORMAL HIGH (ref 70–99)
Potassium: 4.8 mmol/L (ref 3.5–5.1)
Sodium: 140 mmol/L (ref 135–145)
Total Bilirubin: 0.6 mg/dL (ref 0.3–1.2)
Total Protein: 6.1 g/dL — ABNORMAL LOW (ref 6.5–8.1)

## 2020-11-08 LAB — CBG MONITORING, ED
Glucose-Capillary: 102 mg/dL — ABNORMAL HIGH (ref 70–99)
Glucose-Capillary: 96 mg/dL (ref 70–99)

## 2020-11-08 LAB — LIPID PANEL
Cholesterol: 150 mg/dL (ref 0–200)
HDL: 27 mg/dL — ABNORMAL LOW (ref >40)
LDL Cholesterol: 86 mg/dL (ref 0–99)
Total CHOL/HDL Ratio: 5.6 RATIO
Triglycerides: 185 mg/dL — ABNORMAL HIGH (ref <150)
VLDL: 37 mg/dL (ref 0–40)

## 2020-11-08 LAB — RESP PANEL BY RT-PCR (FLU A&B, COVID) ARPGX2
Influenza A by PCR: NEGATIVE
Influenza B by PCR: NEGATIVE
SARS Coronavirus 2 by RT PCR: NEGATIVE

## 2020-11-08 LAB — ECHOCARDIOGRAM LIMITED
Area-P 1/2: 2.54 cm2
S' Lateral: 3.7 cm
Weight: 2878.33 oz

## 2020-11-08 LAB — HEMOGLOBIN A1C
Hgb A1c MFr Bld: 7.6 % — ABNORMAL HIGH (ref 4.8–5.6)
Mean Plasma Glucose: 171.42 mg/dL

## 2020-11-08 LAB — GLUCOSE, CAPILLARY
Glucose-Capillary: 88 mg/dL (ref 70–99)
Glucose-Capillary: 83 mg/dL (ref 70–99)

## 2020-11-08 MED ORDER — APIXABAN 5 MG PO TABS
5.0000 mg | ORAL_TABLET | Freq: Two times a day (BID) | ORAL | Status: DC
  Administered 2020-11-09 – 2020-11-10 (×2): 5 mg via ORAL
  Filled 2020-11-08 (×3): qty 1

## 2020-11-08 MED ORDER — SODIUM ZIRCONIUM CYCLOSILICATE 5 G PO PACK
5.0000 g | PACK | Freq: Once | ORAL | Status: DC
  Filled 2020-11-08: qty 1

## 2020-11-08 MED ORDER — ASPIRIN EC 81 MG PO TBEC: 81.0000 mg | DELAYED_RELEASE_TABLET | Freq: Every day | ORAL | Status: DC

## 2020-11-08 MED ORDER — PANCRELIPASE (LIP-PROT-AMYL) 12000-38000 UNITS PO CPEP
12000.0000 [IU] | ORAL_CAPSULE | Freq: Three times a day (TID) | ORAL | Status: DC
  Administered 2020-11-09 – 2020-11-10 (×3): 12000 [IU] via ORAL
  Filled 2020-11-08 (×13): qty 1

## 2020-11-08 MED ORDER — STROKE: EARLY STAGES OF RECOVERY BOOK
Freq: Once | Status: DC
  Filled 2020-11-08: qty 1

## 2020-11-08 MED ORDER — ATORVASTATIN CALCIUM 80 MG PO TABS
80.0000 mg | ORAL_TABLET | Freq: Every day | ORAL | Status: DC
  Administered 2020-11-09: 80 mg via ORAL
  Filled 2020-11-08 (×2): qty 1

## 2020-11-08 MED ORDER — VITAMIN B-12 1000 MCG PO TABS
1000.0000 ug | ORAL_TABLET | Freq: Every day | ORAL | Status: DC
  Administered 2020-11-10: 1000 ug via ORAL
  Filled 2020-11-08: qty 1

## 2020-11-08 MED ORDER — LORAZEPAM 2 MG/ML IJ SOLN
1.0000 mg | Freq: Once | INTRAMUSCULAR | Status: AC | PRN
  Administered 2020-11-08: 1 mg via INTRAVENOUS
  Filled 2020-11-08: qty 1

## 2020-11-08 MED ORDER — SERTRALINE HCL 50 MG PO TABS
50.0000 mg | ORAL_TABLET | Freq: Every day | ORAL | Status: DC
  Administered 2020-11-10: 50 mg via ORAL
  Filled 2020-11-08: qty 1

## 2020-11-08 MED ORDER — PANTOPRAZOLE SODIUM 40 MG PO TBEC
40.0000 mg | DELAYED_RELEASE_TABLET | Freq: Two times a day (BID) | ORAL | Status: DC
  Administered 2020-11-10 – 2020-11-11 (×2): 40 mg via ORAL
  Filled 2020-11-08 (×3): qty 1

## 2020-11-08 MED ORDER — HALOPERIDOL 0.5 MG PO TABS
0.5000 mg | ORAL_TABLET | Freq: Every morning | ORAL | Status: DC
  Administered 2020-11-12 – 2020-11-15 (×3): 0.5 mg via ORAL
  Filled 2020-11-08 (×4): qty 1

## 2020-11-08 MED ORDER — CARVEDILOL 3.125 MG PO TABS: 6.2500 mg | ORAL_TABLET | Freq: Two times a day (BID) | ORAL | Status: DC

## 2020-11-08 MED ORDER — ASPIRIN 300 MG RE SUPP
300.0000 mg | Freq: Every day | RECTAL | Status: DC
  Administered 2020-11-08 – 2020-11-09 (×2): 300 mg via RECTAL
  Filled 2020-11-08 (×2): qty 1

## 2020-11-08 MED ORDER — HYDRALAZINE HCL 50 MG PO TABS
100.0000 mg | ORAL_TABLET | Freq: Three times a day (TID) | ORAL | Status: DC
  Administered 2020-11-09 – 2020-11-10 (×2): 100 mg via ORAL
  Filled 2020-11-08 (×4): qty 2

## 2020-11-08 MED ORDER — INSULIN ASPART 100 UNIT/ML IJ SOLN: 0.0000 [IU] | Freq: Three times a day (TID) | INTRAMUSCULAR | Status: DC

## 2020-11-08 NOTE — ED Notes (Signed)
PO meds held for swallow study, MD provider aware.

## 2020-11-08 NOTE — ED Notes (Addendum)
Patient is sleeping, awakens to voice but is drowsy. Answers "no" when asked if he can tell where he is. Moves right leg to stretch it out spontaneously but does not respond to any other questions asked by RN. Unable to complete NIH d/t patient uncooperativeness.

## 2020-11-08 NOTE — Plan of Care (Signed)
  Problem: Clinical Measurements: Goal: Will remain free from infection Outcome: Progressing Goal: Cardiovascular complication will be avoided Outcome: Progressing   Problem: Coping: Goal: Level of anxiety will decrease Outcome: Progressing   Problem: Elimination: Goal: Will not experience complications related to urinary retention Outcome: Progressing   Problem: Safety: Goal: Ability to remain free from injury will improve Outcome: Progressing

## 2020-11-08 NOTE — Progress Notes (Signed)
SLP Cancellation Note  Patient Details Name: Randall [name] MRN: [id number] DOB: [birth year]   Cancelled treatment:       Reason Eval/Treat Not Completed: Patient at procedure or test/unavailable. Pt at MRI. Will f/u as able for completion of bedside swallow eval.    Avie Echevaria, MA, CCC-SLP Acute Rehabilitation Services Office Number: [phone]  Paulette Blanch 11/08/2020, 8:47 AM

## 2020-11-08 NOTE — Progress Notes (Addendum)
Patient ID: Randall [name], male   DOB: 1945/10/28, 75 y.o.   MRN: [id number]  PROGRESS NOTE    Verdie Barrows  DIY:[id number] DOB: 01-25-1945 DOA: 11/07/2020 PCP: Ashley Royalty, NP    Brief Narrative:  Patient is a 75 year old with history of vascular dementia atrial fibrillation Eliquis who was sent from SNF for left arm weakness and slurred speech.  In the ED was noted to have a right MCA infarct involving the corona radiata.  There are multiple old infarcts noted and critical stenosis of the blood flow in several areas.   Assessment & Plan:   Principal Problem:   CVA (cerebral vascular accident) (HCC) Active Problems:   Hyperkalemia   Type 2 diabetes mellitus (HCC)   Vascular dementia (HCC)  Acute/subacute embolic infarct of the right posterior limb of the internal capsule and right corona radiata - Continue Eliquis - PT/OT/SLT - Frequent neuro checks and keep on telemetry -Systolic blood pressure goal of less than 130-150 per neurology   AKI on CKD stage 2 -BUN of 40 with creatinine of 3.6 from prior of 1.43-->down to 3.0 today -anticipate he will improve with continuous IV fluid hydration and trend -avoid nephrotoxic agents    Hyperkalemia -mild K of 5.3-->4.8 -given Lokelma x 1    Type 2 diabetes -AIC of 8 in 08/2020 -place on low dose SSI    Vascular dementia -only oriented to self. Reportedly had behavior issues in the past     DVT prophylaxis: MH:[id number] Code Status: DNR  Family Communication: Daughter-in-law, Mardella Layman, via phone.  They are quite interested in palliative care and possibly to hospice.  They noted the patient needs to be in a SNF he had previously been in assisted living. Disposition Plan: SNF   Consultants:  Neurology Palliative care  Procedures: Echo  Antimicrobials: Anti-infectives (From admission, onward)    None        Subjective: Patient is quite somnolent but will arouse does not report any  complaints.  Objective: Vitals:   11/08/20 0935 11/08/20 1100 11/08/20 1200 11/08/20 1300  BP: 122/71 (!) 124/53 (!) 122/52 (!) 121/51  Pulse: (!) 47 (!) 59 (!) 56 (!) 55  Resp: 10 14 15 16   Temp:      TempSrc:      SpO2: 98% 97% 97% 96%  Weight:       No intake or output data in the 24 hours ending 11/08/20 1301 Filed Weights   11/07/20 2228  Weight: 81.6 kg    Examination:  General exam: Appears calm and comfortable  Respiratory system: Clear to auscultation. Respiratory effort normal. Cardiovascular system: S1 & S2 heard, RRR.  Gastrointestinal system: Abdomen is nondistended, soft and nontender.  Central nervous system: Somnolent Extremities: Symmetric  Skin: No rashes    Data Reviewed: I have personally reviewed following labs and imaging studies  CBC: Recent Labs  Lab 11/07/20 2221 11/07/20 2226  WBC 7.7  --   NEUTROABS 5.8  --   HGB 13.2 14.3  HCT 43.6 42.0  MCV 97.3  --   PLT 253  --    Basic Metabolic Panel: Recent Labs  Lab 11/07/20 2221 11/07/20 2226 11/08/20 1010  NA 139 141 140  K 5.5* 5.3* 4.8  CL 104 106 108  CO2 23  --  22  GLUCOSE 150* 150* 102*  BUN 39* 40* 38*  CREATININE 3.33* 3.60* 3.03*  CALCIUM 9.0  --  8.4*   GFR: Estimated Creatinine Clearance: 21.1 mL/min (A) (by C-G formula  based on SCr of 3.03 mg/dL (H)). Liver Function Tests: Recent Labs  Lab 11/07/20 2221 11/08/20 1010  AST 20 20  ALT 12 10  ALKPHOS 102 87  BILITOT 0.5 0.6  PROT 7.1 6.1*  ALBUMIN 3.4* 2.9*    Coagulation Profile: Recent Labs  Lab 11/07/20 2221  INR 1.7*   HbA1C: Recent Labs    11/07/20 2221  HGBA1C 7.6*   CBG: Recent Labs  Lab 11/07/20 2225 11/08/20 0736 11/08/20 1156  GLUCAP 151* 102* 96   Lipid Profile: Recent Labs    11/07/20 2221  CHOL 150  HDL 27*  LDLCALC 86  TRIG 154*  CHOLHDL 5.6     Recent Results (from the past 240 hour(s))  Resp Panel by RT-PCR (Flu A&B, Covid) Nasopharyngeal Swab     Status: None    Collection Time: 11/08/20  2:06 AM   Specimen: Nasopharyngeal Swab; Nasopharyngeal(NP) swabs in vial transport medium  Result Value Ref Range Status   SARS Coronavirus 2 by RT PCR NEGATIVE NEGATIVE Final    Comment: (NOTE) SARS-CoV-2 target nucleic acids are NOT DETECTED.  The SARS-CoV-2 RNA is generally detectable in upper respiratory specimens during the acute phase of infection. The lowest concentration of SARS-CoV-2 viral copies this assay can detect is 138 copies/mL. A negative result does not preclude SARS-Cov-2 infection and should not be used as the sole basis for treatment or other patient management decisions. A negative result may occur with  improper specimen collection/handling, submission of specimen other than nasopharyngeal swab, presence of viral mutation(s) within the areas targeted by this assay, and inadequate number of viral copies(<138 copies/mL). A negative result must be combined with clinical observations, patient history, and epidemiological information. The expected result is Negative.  Fact Sheet for Patients:  BloggerCourse.com  Fact Sheet for Healthcare Providers:  SeriousBroker.it  This test is no t yet approved or cleared by the [place] FDA and  has been authorized for detection and/or diagnosis of SARS-CoV-2 by FDA under an Emergency Use Authorization (EUA). This EUA will remain  in effect (meaning this test can be used) for the duration of the COVID-19 declaration under Section 564(b)(1) of the Act, 21 U.S.C.section 360bbb-3(b)(1), unless the authorization is terminated  or revoked sooner.       Influenza A by PCR NEGATIVE NEGATIVE Final   Influenza B by PCR NEGATIVE NEGATIVE Final    Comment: (NOTE) The Xpert Xpress SARS-CoV-2/FLU/RSV plus assay is intended as an aid in the diagnosis of influenza from Nasopharyngeal swab specimens and should not be used as a sole basis for treatment.  Nasal washings and aspirates are unacceptable for Xpert Xpress SARS-CoV-2/FLU/RSV testing.  Fact Sheet for Patients: BloggerCourse.com  Fact Sheet for Healthcare Providers: SeriousBroker.it  This test is not yet approved or cleared by the [place] FDA and has been authorized for detection and/or diagnosis of SARS-CoV-2 by FDA under an Emergency Use Authorization (EUA). This EUA will remain in effect (meaning this test can be used) for the duration of the COVID-19 declaration under Section 564(b)(1) of the Act, 21 U.S.C. section 360bbb-3(b)(1), unless the authorization is terminated or revoked.  Performed at [facility], [address], [place], [place] [address]       Radiology Studies: MR ANGIO HEAD WO CONTRAST  Result Date: 11/08/2020 CLINICAL DATA:  History of vascular dementia, CVA, AFib presenting with slurred speech and left arm weakness EXAM: MRI HEAD WITHOUT CONTRAST MRA HEAD WITHOUT CONTRAST MRA NECK WITHOUT CONTRAST TECHNIQUE: Multiplanar, multiecho  pulse sequences of the brain and surrounding structures were obtained without intravenous contrast. Angiographic images of the Circle of Willis were obtained using MRA technique without intravenous contrast. Angiographic images of the neck were obtained using MRA technique without intravenous contrast. Carotid stenosis measurements (when applicable) are obtained utilizing NASCET criteria, using the distal internal carotid diameter as the denominator. COMPARISON:  Noncontrast CT head obtained 1 day prior, brain MRI 09/18/2020 FINDINGS: MRI HEAD FINDINGS Brain: There is patchy diffusion restriction in the right MCA distribution primarily in the corona radiata but also involving the peritrigonal white matter and internal capsule. There is no associated hemorrhage. There is patchy associated FLAIR signal abnormality. There is more ill-defined diffusion restriction in the left  occipital lobe, though this is in an area of encephalomalacia and is favored to reflect T2 shine through from a prior infarct. There is a background of extensive FLAIR signal abnormality throughout the subcortical and periventricular white matter consistent with advanced chronic white matter microangiopathy. As above, there is a remote left occipital lobe infarct. Additional prior infarcts are seen in the left cerebellar hemisphere. The ventricles are stable in size. There is no solid mass lesion. There is no midline shift. Vascular: The major flow voids are present. The vasculature is assessed in full below. Skull and upper cervical spine: Normal marrow signal. Sinuses/Orbits: The paranasal sinuses are clear. The globes and orbits are unremarkable. Other: None. MRA HEAD FINDINGS Anterior circulation: The intracranial ICAs are patent. There is focal critical stenosis of the right M1 segment with a possible trickle of flow related enhancement traversing the stenosis (see key image). This appears similar to the prior CTA head from 09/17/2020 allowing for difference in modality. There is reconstitution of flow in the distal right MCA branches, though this appears diminished compared to the contralateral side. The left MCA is patent. There is focal high-grade stenosis of the left A2 segment, similar to the prior CTA (3-152). The ACAs are otherwise patent. Posterior circulation: The bilateral V4 segments are patent. The basilar artery is patent. There is diminished enhancement of the left PCA, likely unchanged compared to the prior CTA head and consistent with the history of prior left PCA infarct. The right PCA is patent and predominantly supplied from a prominent right posterior communicating artery. There is a fetal origin of the left PCA. There is no aneurysm. Anatomic variants: As above. MRA NECK FINDINGS Aortic arch: The imaged portions of the aortic arch are unremarkable Right carotid system: The right common,  internal, and external carotid arteries demonstrate normal flow related enhancement without evidence of high-grade stenosis, occlusion, dissection, or aneurysm. Left carotid system: The left common, internal, and external carotid arteries demonstrate normal flow related enhancement without evidence of high-grade stenosis, occlusion, dissection, or aneurysm. Vertebral arteries: The vertebral arteries are patent with antegrade flow. There is no evidence of high-grade stenosis, occlusion, dissection, or aneurysm. IMPRESSION: 1. Acute to early subacute infarcts throughout the right MCA distribution primarily involving the corona radiata. 2. Remote infarcts in the left occipital lobe and left cerebellar hemisphere. 3. Critical stenosis of the right M1 segment with a trickle of flow lead enhancement traversing the stenosis, similar to the prior CTA head allowing for difference in modality. 4. Focal high-grade stenosis of the left A2 segment and multifocal stenosis with overall diminished enhancement in the left PCA are not significantly changed. 5. Patent vasculature of the neck with no high-grade stenosis, occlusion, dissection, or aneurysm. Electronically Signed   By: Selena Lesser.D.  On: 11/08/2020 09:53   MR ANGIO NECK WO CONTRAST  Result Date: 11/08/2020 CLINICAL DATA:  History of vascular dementia, CVA, AFib presenting with slurred speech and left arm weakness EXAM: MRI HEAD WITHOUT CONTRAST MRA HEAD WITHOUT CONTRAST MRA NECK WITHOUT CONTRAST TECHNIQUE: Multiplanar, multiecho pulse sequences of the brain and surrounding structures were obtained without intravenous contrast. Angiographic images of the Circle of Willis were obtained using MRA technique without intravenous contrast. Angiographic images of the neck were obtained using MRA technique without intravenous contrast. Carotid stenosis measurements (when applicable) are obtained utilizing NASCET criteria, using the distal internal carotid diameter as  the denominator. COMPARISON:  Noncontrast CT head obtained 1 day prior, brain MRI 09/18/2020 FINDINGS: MRI HEAD FINDINGS Brain: There is patchy diffusion restriction in the right MCA distribution primarily in the corona radiata but also involving the peritrigonal white matter and internal capsule. There is no associated hemorrhage. There is patchy associated FLAIR signal abnormality. There is more ill-defined diffusion restriction in the left occipital lobe, though this is in an area of encephalomalacia and is favored to reflect T2 shine through from a prior infarct. There is a background of extensive FLAIR signal abnormality throughout the subcortical and periventricular white matter consistent with advanced chronic white matter microangiopathy. As above, there is a remote left occipital lobe infarct. Additional prior infarcts are seen in the left cerebellar hemisphere. The ventricles are stable in size. There is no solid mass lesion. There is no midline shift. Vascular: The major flow voids are present. The vasculature is assessed in full below. Skull and upper cervical spine: Normal marrow signal. Sinuses/Orbits: The paranasal sinuses are clear. The globes and orbits are unremarkable. Other: None. MRA HEAD FINDINGS Anterior circulation: The intracranial ICAs are patent. There is focal critical stenosis of the right M1 segment with a possible trickle of flow related enhancement traversing the stenosis (see key image). This appears similar to the prior CTA head from 09/17/2020 allowing for difference in modality. There is reconstitution of flow in the distal right MCA branches, though this appears diminished compared to the contralateral side. The left MCA is patent. There is focal high-grade stenosis of the left A2 segment, similar to the prior CTA (3-152). The ACAs are otherwise patent. Posterior circulation: The bilateral V4 segments are patent. The basilar artery is patent. There is diminished enhancement of  the left PCA, likely unchanged compared to the prior CTA head and consistent with the history of prior left PCA infarct. The right PCA is patent and predominantly supplied from a prominent right posterior communicating artery. There is a fetal origin of the left PCA. There is no aneurysm. Anatomic variants: As above. MRA NECK FINDINGS Aortic arch: The imaged portions of the aortic arch are unremarkable Right carotid system: The right common, internal, and external carotid arteries demonstrate normal flow related enhancement without evidence of high-grade stenosis, occlusion, dissection, or aneurysm. Left carotid system: The left common, internal, and external carotid arteries demonstrate normal flow related enhancement without evidence of high-grade stenosis, occlusion, dissection, or aneurysm. Vertebral arteries: The vertebral arteries are patent with antegrade flow. There is no evidence of high-grade stenosis, occlusion, dissection, or aneurysm. IMPRESSION: 1. Acute to early subacute infarcts throughout the right MCA distribution primarily involving the corona radiata. 2. Remote infarcts in the left occipital lobe and left cerebellar hemisphere. 3. Critical stenosis of the right M1 segment with a trickle of flow lead enhancement traversing the stenosis, similar to the prior CTA head allowing for difference in modality. 4.  Focal high-grade stenosis of the left A2 segment and multifocal stenosis with overall diminished enhancement in the left PCA are not significantly changed. 5. Patent vasculature of the neck with no high-grade stenosis, occlusion, dissection, or aneurysm. Electronically Signed   By: Lesia Hausen M.D.   On: 11/08/2020 09:53   MR Brain Wo Contrast (neuro protocol)  Result Date: 11/08/2020 CLINICAL DATA:  History of vascular dementia, CVA, AFib presenting with slurred speech and left arm weakness EXAM: MRI HEAD WITHOUT CONTRAST MRA HEAD WITHOUT CONTRAST MRA NECK WITHOUT CONTRAST TECHNIQUE:  Multiplanar, multiecho pulse sequences of the brain and surrounding structures were obtained without intravenous contrast. Angiographic images of the Circle of Willis were obtained using MRA technique without intravenous contrast. Angiographic images of the neck were obtained using MRA technique without intravenous contrast. Carotid stenosis measurements (when applicable) are obtained utilizing NASCET criteria, using the distal internal carotid diameter as the denominator. COMPARISON:  Noncontrast CT head obtained 1 day prior, brain MRI 09/18/2020 FINDINGS: MRI HEAD FINDINGS Brain: There is patchy diffusion restriction in the right MCA distribution primarily in the corona radiata but also involving the peritrigonal white matter and internal capsule. There is no associated hemorrhage. There is patchy associated FLAIR signal abnormality. There is more ill-defined diffusion restriction in the left occipital lobe, though this is in an area of encephalomalacia and is favored to reflect T2 shine through from a prior infarct. There is a background of extensive FLAIR signal abnormality throughout the subcortical and periventricular white matter consistent with advanced chronic white matter microangiopathy. As above, there is a remote left occipital lobe infarct. Additional prior infarcts are seen in the left cerebellar hemisphere. The ventricles are stable in size. There is no solid mass lesion. There is no midline shift. Vascular: The major flow voids are present. The vasculature is assessed in full below. Skull and upper cervical spine: Normal marrow signal. Sinuses/Orbits: The paranasal sinuses are clear. The globes and orbits are unremarkable. Other: None. MRA HEAD FINDINGS Anterior circulation: The intracranial ICAs are patent. There is focal critical stenosis of the right M1 segment with a possible trickle of flow related enhancement traversing the stenosis (see key image). This appears similar to the prior CTA head  from 09/17/2020 allowing for difference in modality. There is reconstitution of flow in the distal right MCA branches, though this appears diminished compared to the contralateral side. The left MCA is patent. There is focal high-grade stenosis of the left A2 segment, similar to the prior CTA (3-152). The ACAs are otherwise patent. Posterior circulation: The bilateral V4 segments are patent. The basilar artery is patent. There is diminished enhancement of the left PCA, likely unchanged compared to the prior CTA head and consistent with the history of prior left PCA infarct. The right PCA is patent and predominantly supplied from a prominent right posterior communicating artery. There is a fetal origin of the left PCA. There is no aneurysm. Anatomic variants: As above. MRA NECK FINDINGS Aortic arch: The imaged portions of the aortic arch are unremarkable Right carotid system: The right common, internal, and external carotid arteries demonstrate normal flow related enhancement without evidence of high-grade stenosis, occlusion, dissection, or aneurysm. Left carotid system: The left common, internal, and external carotid arteries demonstrate normal flow related enhancement without evidence of high-grade stenosis, occlusion, dissection, or aneurysm. Vertebral arteries: The vertebral arteries are patent with antegrade flow. There is no evidence of high-grade stenosis, occlusion, dissection, or aneurysm. IMPRESSION: 1. Acute to early subacute infarcts throughout the right  MCA distribution primarily involving the corona radiata. 2. Remote infarcts in the left occipital lobe and left cerebellar hemisphere. 3. Critical stenosis of the right M1 segment with a trickle of flow lead enhancement traversing the stenosis, similar to the prior CTA head allowing for difference in modality. 4. Focal high-grade stenosis of the left A2 segment and multifocal stenosis with overall diminished enhancement in the left PCA are not  significantly changed. 5. Patent vasculature of the neck with no high-grade stenosis, occlusion, dissection, or aneurysm. Electronically Signed   By: Lesia Hausen M.D.   On: 11/08/2020 09:53   CT HEAD CODE STROKE WO CONTRAST  Result Date: 11/07/2020 CLINICAL DATA:  Code stroke. EXAM: CT HEAD WITHOUT CONTRAST TECHNIQUE: Contiguous axial images were obtained from the base of the skull through the vertex without intravenous contrast. COMPARISON:  10/26/2020. FINDINGS: Brain: New hypodensities in the right posterior limb of the internal capsule (series 3, image 17) and right corona radiata (series 3, image 20), of indeterminate acuity. Redemonstrated hypodensity in the left PCA territory, right ACA territory, left medulla, and left cerebellum. Periventricular white matter changes, likely the sequela of chronic small vessel ischemic disease. Vascular: No hyperdense vessel. Skull: Normal. Negative for fracture or focal lesion. Sinuses/Orbits: No acute finding. Other: The mastoids are well aerated. ASPECTS [facility] Stroke Program Early CT Score) - Ganglionic level infarction (caudate, lentiform nuclei, internal capsule, insula, M1-M3 cortex): 6 - Supraganglionic infarction (M4-M6 cortex): 3 Total score (0-10 with 10 being normal): 9 IMPRESSION: 1. New hypodensities in the right posterior limb of the internal capsule and right corona radiata, likely acute or subacute infarcts. No hemorrhage. 2. ASPECTS is 9. Code stroke imaging results were communicated on 11/07/2020 at 10:39 pm to provider [facility] via secure text paging. Electronically Signed   By: Wiliam Ke M.D.   On: 11/07/2020 22:42     Scheduled Meds:   stroke: mapping our early stages of recovery book   Does not apply Once   apixaban  5 mg Oral BID   aspirin  300 mg Rectal Daily   atorvastatin  80 mg Oral QHS   haloperidol  0.5 mg Oral q morning   insulin aspart  0-6 Units Subcutaneous TID WC   lipase/protease/amylase  12,000 Units Oral TID WC & HS    sodium chloride flush  3 mL Intravenous Once   sodium zirconium cyclosilicate  5 g Oral Once   Continuous Infusions:  sodium chloride 75 mL/hr at 11/07/20 2330     LOS: 0 days    Reva Bores, MD 11/08/2020 1:01 PM [phone] [facility] If 7PM-7AM, please contact night-coverage 11/08/2020, 1:01 PM

## 2020-11-08 NOTE — ED Notes (Signed)
The patient is currently MRI, vitals will be updated upon arrival back to unit.

## 2020-11-08 NOTE — H&P (Signed)
History and Physical    Randall [name] SPQ:[id number] DOB: 15-Feb-1945 DOA: 11/07/2020  PCP: Pcp, No  Patient coming from: [facility] skilled nursing facility  I have personally briefly reviewed patient's old medical records in [place]  Chief Complaint: Slurred speech and left arm weakness  HPI: Randall [name] is a 75 y.o. male with medical history significant for vascular dementia, history of CVA, atrial fibrillation on Eliquis who presents from skilled nursing facility with concerns of slurred speech and left arm weakness.  Patient alert and oriented only to self and answers no to every question.  History obtained from ED documentation ED physician.  Reportedly he was sent here by his facility for slurred vision and left arm weakness at 1830.  Last stroke in August with left cerebral infarct.  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  [name] recently, he has been evaluated several times this month for recurrent fall with negative work-up including CT head.   ED Course: Presented as a code stroke and had findings of new hypodensity in the right posterior limb of the internal capsule and right corona radiata on CT head. CBC was unremarkable without leukocytosis or anemia.  Sodium 141, potassium of 5.3, creatinine significantly elevated to 3.6 from prior of 1.43, BUN of 40, BG of 150.  Neurology has evaluated and recommended further stroke work-up.  Hospitalist then called for admission.   Review of Systems: Unable to obtain for ROS due to dementia  Past Medical History:  Diagnosis Date   Atrial fibrillation ([place])    CVA (cerebral vascular accident) ([place])    DM (diabetes mellitus) ([place])    HTN (hypertension)     Surgical history Unable to obtain due to dementia   reports that he does not have a smoking history on file. He has never used smokeless tobacco. He reports  that he does not drink alcohol. No history on file for drug use. Social History  No Known Allergies  Family History  Family history unknown: Yes     Prior to Admission medications   Medication Sig Start Date End Date Taking? Authorizing Provider  acetaminophen (TYLENOL) 325 MG tablet Take 650 mg by mouth every 6 (six) hours as needed (for pain).    [provider]  acetaminophen (TYLENOL) 325 MG tablet Take 650 mg by mouth every 6 (six) hours as needed (pain).    [provider]  apixaban (ELIQUIS) 5 MG TABS tablet Take 1 tablet (5 mg total) by mouth in the morning and at bedtime. 09/19/20   Caren Griffins, MD  apixaban (ELIQUIS) 5 MG TABS tablet Take 5 mg by mouth 2 (two) times daily.    [provider]  aspirin-sod bicarb-citric acid (ALKA-SELTZER ORIGINAL) 325 MG TBEF tablet Take 650 mg by mouth daily as needed (heartburn).    [provider]  atorvastatin (LIPITOR) 40 MG tablet Take 2 tablets (80 mg total) by mouth at bedtime. 09/19/20   Caren Griffins, MD  atorvastatin (LIPITOR) 40 MG tablet Take 80 mg by mouth at bedtime.    [provider]  blood glucose meter kit and supplies Dispense based on patient and insurance preference. Use up to four times daily as directed. (FOR ICD-10 E10.9, E11.9). 09/19/20   Caren Griffins, MD  carvedilol (COREG) 6.25 MG tablet Take 1 tablet (6.25 mg total) by mouth 2 (two) times  daily with a meal. 09/19/20   Gherghe, Vella Redhead, MD  carvedilol (COREG) 6.25 MG tablet Take 6.25 mg by mouth 2 (two) times daily.    [provider]  dapagliflozin propanediol (FARXIGA) 10 MG TABS tablet Take 10 mg by mouth every morning.    [provider]  dicyclomine (BENTYL) 10 MG capsule Take 1 capsule (10 mg total) by mouth 4 (four) times daily -  before meals and at bedtime. 09/19/20   Caren Griffins, MD  dicyclomine (BENTYL) 10 MG capsule Take 10 mg by mouth 4 (four) times daily.    [provider]   docusate sodium (COLACE) 100 MG capsule Take 1 capsule (100 mg total) by mouth in the morning and at bedtime. 09/19/20   Caren Griffins, MD  docusate sodium (COLACE) 100 MG capsule Take 100 mg by mouth 2 (two) times daily.    [provider]  glipiZIDE (GLUCOTROL) 5 MG tablet Take 1 tablet (5 mg total) by mouth 2 (two) times daily before a meal. 09/19/20   Gherghe, Vella Redhead, MD  glipiZIDE (GLUCOTROL) 5 MG tablet Take 5 mg by mouth 2 (two) times daily.    [provider]  haloperidol (HALDOL) 0.5 MG tablet Take 0.5 mg by mouth every morning.    [provider]  hydrALAZINE (APRESOLINE) 50 MG tablet Take 2 tablets (100 mg total) by mouth 3 (three) times daily. 09/19/20   Caren Griffins, MD  hydrALAZINE (APRESOLINE) 50 MG tablet Take 100 mg by mouth 3 (three) times daily.    [provider]  insulin glargine (LANTUS) 100 UNIT/ML injection Inject 8 Units into the skin every morning.    [provider]  insulin glargine, 1 Unit Dial, (TOUJEO) 300 UNIT/ML Solostar Pen Inject 5 Units into the skin daily. 09/19/20   Caren Griffins, MD  Insulin Syringe-Needle U-100 (INSULIN SYRINGE .5CC/30GX1/2") 30G X 1/2" 0.5 ML MISC 1 each by Does not apply route daily. 09/19/20   Caren Griffins, MD  lipase/protease/amylase (CREON) 12000-38000 units CPEP capsule Take 1 capsule (12,000 Units total) by mouth 4 (four) times daily. 09/19/20   Caren Griffins, MD  lipase/protease/amylase (CREON) 12000-38000 units CPEP capsule Take 12,000 Units by mouth 4 (four) times daily.    [provider]  meclizine (ANTIVERT) 25 MG tablet Take 1 tablet (25 mg total) by mouth every 8 (eight) hours as needed for dizziness. 09/19/20   Caren Griffins, MD  meclizine (ANTIVERT) 25 MG tablet Take 25 mg by mouth every 8 (eight) hours as needed for dizziness.    [provider]  mirtazapine (REMERON) 7.5 MG tablet Take 7.5 mg by mouth at bedtime.    [provider]   ondansetron (ZOFRAN) 4 MG tablet Take 1 tablet (4 mg total) by mouth every 8 (eight) hours as needed for nausea or vomiting. 09/19/20   Caren Griffins, MD  ondansetron (ZOFRAN) 4 MG tablet Take 4 mg by mouth See admin instructions. Take one tablet (4 mg) by mouth three times daily, may also take one tablet (4 mg) every 8 hours as needed for nausea/vomiting - max 3 tablets/24 hours    [provider]  pantoprazole (PROTONIX) 40 MG tablet Take 1 tablet (40 mg total) by mouth 2 (two) times daily before a meal. 09/19/20   Gherghe, Vella Redhead, MD  pantoprazole (PROTONIX) 40 MG tablet Take 40 mg by mouth 2 (two) times daily.    [provider]  polyethylene glycol (MIRALAX /  GLYCOLAX) 17 g packet Take 17 g by mouth daily as needed (constipation).    [provider]  polyethylene glycol powder (GLYCOLAX/MIRALAX) 17 GM/SCOOP powder Take 17 g by mouth daily as needed for moderate constipation. 09/19/20   Caren Griffins, MD  sertraline (ZOLOFT) 25 MG tablet Take 25 mg by mouth every morning.    [provider]  valsartan (DIOVAN) 320 MG tablet Take 1 tablet (320 mg total) by mouth daily. 09/19/20   Caren Griffins, MD  valsartan (DIOVAN) 320 MG tablet Take 320 mg by mouth every morning.    [provider]  vitamin B-12 (CYANOCOBALAMIN) 1000 MCG tablet Take 1 tablet (1,000 mcg total) by mouth daily. 09/19/20   Caren Griffins, MD  vitamin B-12 (CYANOCOBALAMIN) 1000 MCG tablet Take 1,000 mcg by mouth every morning.    [provider]  zolpidem (AMBIEN) 5 MG tablet Take 5 mg by mouth at bedtime.    [provider]    Physical Exam: Vitals:   11/07/20 2300 11/07/20 2324 11/08/20 0028 11/08/20 0030  BP: 106/79  (!) 109/54   Pulse: 65  (!) 56 (!) 55  Resp: _0 Temp:  (!) 97.1 F (36.2 C)    TempSrc:  Axillary    SpO2: 93%  97% 96%  Weight:        Constitutional: NAD, calm, comfortable, elderly chronically ill-appearing male laying flat  in bed with eyes closed.  He was responsive to voice. He was restless at times with restraining mittens on both hands.  Vitals:   11/07/20 2300 11/07/20 2324 11/08/20 0028 11/08/20 0030  BP: 106/79  (!) 109/54   Pulse: 65  (!) 56 (!) 55  Resp: _1 Temp:  (!) 97.1 F (36.2 C)    TempSrc:  Axillary    SpO2: 93%  97% 96%  Weight:       Eyes:  lids and conjunctivae normal ENMT: Mucous membranes are moist.  Neck: normal, supple Respiratory: clear to auscultation bilaterally, no wheezing, no crackles. Normal respiratory effort. No accessory muscle use.  Cardiovascular: Regular rate and rhythm, no murmurs / rubs / gallops. No extremity edema. Abdomen: no grimacing with palpation of the abdomen, no masses palpated.  Bowel sounds positive.  Musculoskeletal: no clubbing / cyanosis. No joint deformity upper and lower extremities.  Skin: no rashes, lesions, ulcers. No induration Neurologic: CN 2-12 grossly intact. Alert to self but unless disoriented. Only answers no to questions. Able to open eyes, move LE on command. Did not lift UE on command.  Psychiatric: Alert only to self. Restless at times.     Labs on Admission: I have personally reviewed following labs and imaging studies  CBC: Recent Labs  Lab 11/07/20 2221 11/07/20 2226  WBC 7.7  --   NEUTROABS 5.8  --   HGB 13.2 14.3  HCT 43.6 42.0  MCV 97.3  --   PLT 253  --    Basic Metabolic Panel: Recent Labs  Lab 11/07/20 2221 11/07/20 2226  NA 139 141  K 5.5* 5.3*  CL 104 106  CO2 23  --   GLUCOSE 150* 150*  BUN 39* 40*  CREATININE 3.33* 3.60*  CALCIUM 9.0  --    GFR: Estimated Creatinine Clearance: 17.7 mL/min (A) (by C-G formula based on SCr of 3.6 mg/dL (H)). Liver Function Tests: Recent Labs  Lab 11/07/20 2221  AST 20  ALT 12  ALKPHOS 102  BILITOT 0.5  PROT 7.1  ALBUMIN 3.4*   No results for input(s): LIPASE, AMYLASE in the last 168 hours. No results for input(s): AMMONIA in the last 168  hours. Coagulation Profile: Recent Labs  Lab 11/07/20 2221  INR 1.7*   Cardiac Enzymes: No results for input(s): CKTOTAL, CKMB, CKMBINDEX, TROPONINI in the last 168 hours. BNP (last 3 results) No results for input(s): PROBNP in the last 8760 hours. HbA1C: No results for input(s): HGBA1C in the last 72 hours. CBG: Recent Labs  Lab 11/07/20 2225  GLUCAP 151*   Lipid Profile: No results for input(s): CHOL, HDL, LDLCALC, TRIG, CHOLHDL, LDLDIRECT in the last 72 hours. Thyroid Function Tests: No results for input(s): TSH, T4TOTAL, FREET4, T3FREE, THYROIDAB in the last 72 hours. Anemia Panel: No results for input(s): VITAMINB12, FOLATE, FERRITIN, TIBC, IRON, RETICCTPCT in the last 72 hours. Urine analysis:    Component Value Date/Time   COLORURINE YELLOW 10/27/2020 0634   APPEARANCEUR CLEAR 10/27/2020 0634   LABSPEC 1.023 10/27/2020 0634   PHURINE 5.0 10/27/2020 0634   GLUCOSEU >=500 (A) 10/27/2020 0634   HGBUR NEGATIVE 10/27/2020 [place] 10/27/2020 [place] 10/27/2020 0634   PROTEINUR 100 (A) 10/27/2020 0634   NITRITE NEGATIVE 10/27/2020 0634   LEUKOCYTESUR NEGATIVE 10/27/2020 0634    Radiological Exams on Admission: CT HEAD CODE STROKE WO CONTRAST  Result Date: 11/07/2020 CLINICAL DATA:  Code stroke. EXAM: CT HEAD WITHOUT CONTRAST TECHNIQUE: Contiguous axial images were obtained from the base of the skull through the vertex without intravenous contrast. COMPARISON:  10/26/2020. FINDINGS: Brain: New hypodensities in the right posterior limb of the internal capsule (series 3, image 17) and right corona radiata (series 3, image 20), of indeterminate acuity. Redemonstrated hypodensity in the left PCA territory, right ACA territory, left medulla, and left cerebellum. Periventricular white matter changes, likely the sequela of chronic small vessel ischemic disease. Vascular: No hyperdense vessel. Skull: Normal. Negative for fracture or focal  lesion. Sinuses/Orbits: No acute finding. Other: The mastoids are well aerated. ASPECTS [facility] Stroke Program Early CT Score) - Ganglionic level infarction (caudate, lentiform nuclei, internal capsule, insula, M1-M3 cortex): 6 - Supraganglionic infarction (M4-M6 cortex): 3 Total score (0-10 with 10 being normal): 9 IMPRESSION: 1. New hypodensities in the right posterior limb of the internal capsule and right corona radiata, likely acute or subacute infarcts. No hemorrhage. 2. ASPECTS is 9. Code stroke imaging results were communicated on 11/07/2020 at 10:39 pm to provider [facility] via secure text paging. Electronically Signed   By: Merilyn Baba M.D.   On: 11/07/2020 22:42      Assessment/Plan  Acute/subacute embolic infarct of the right posterior limb of the internal capsule and right corona radiata - Obtain MRI brain - MRA head and neck - Continue Eliquis - PT/OT/SLT - Frequent neuro checks and keep on telemetry -Systolic blood pressure goal of less than 160 per neurology  AKI on CKD stage 2 -BUN of 40 with creatinine of 3.6 from prior of 1.43 -anticipate he will improve with continuous IV fluid hydration and follow repeat creatinine tomorrow -avoid nephrotoxic agent   Hyperkalemia -mild K of 5.3. Likely due to worsening renal insufficency.  -give Lokelma x 1   Type 2 diabetes -AIC of 8 in 08/2020 -place on low dose SSI   Vascular dementia -only oriented to self. Reportedly had behavior issues in the past  DVT prophylaxis:.Eliquis Code Status: Full Family Communication: No family bedside  disposition Plan: back to SNF with observation Consults called:  Neurology Admission status: Observation  Level of care: Telemetry Cardiac  Status is: Observation  The patient remains OBS appropriate and will d/c before 2 midnights.        Orene Desanctis DO [facility]   If 7PM-7AM, please contact night-coverage [email or web address]   11/08/2020, 1:12 AM

## 2020-11-08 NOTE — Progress Notes (Addendum)
STROKE TEAM PROGRESS NOTE   ATTENDING NOTE: I reviewed above note and agree with the assessment and plan. Pt was seen and examined.   75 year old male with history of A. fib on Eliquis, diabetes, hypertension, dementia residing in nursing home, history of stroke with chronic right MCA stenosis admitted for slurred speech, left arm weakness.  Also found to have hypotension and dehydration.  CT concerning for right internal capsule and corona radiata infarcts, old left PCA infarct.  MRI confirmed patchy acute right MCA infarct, chronic large left PCA infarct.  MRA head and neck redemonstrated critical stenosis right M1, high-grade stenosis left A2, chronic left PCA stenosis.  EF 60 to 65%, A1c 7.6, LDL 86.  Creatinine 3.3-> 3.6, with baseline creatinine 1.2-1.4.  In 08/2020 patient admitted for falling, MRI found left cerebellar infarct with old left occipital PCA infarct.  CTA head and neck showed severe right M1, right VA, left A2 and the left P2/P3 stenosis.  LDL 59, A1c 8.0, EF 55 to 17%.  Patient discharged with DAPT.  Patient current right MCA stroke likely due to right M1 high-grade stenosis in the setting of dehydration and hypotension.  Treat hypotension and dehydration per primary team, BP goal 130-160 given severe intracranial stenosis.  Resume Eliquis and statin for stroke prevention once p.o. access.  Currently on aspirin PR 300 at the meantime until p.o. access.  For detailed assessment and plan, please refer to above as I have made changes wherever appropriate.   Neurology will sign off. Please call with questions. Pt will follow up with stroke clinic NP at [facility] in about 4 weeks. Thanks for the consult.  [name], MD PhD Stroke Neurology 11/08/2020 7:47 PM    INTERVAL HISTORY No one is at the bedside at time of this exam.   Vitals:   11/08/20 0430 11/08/20 0530 11/08/20 0730 11/08/20 0800  BP: 107/62 (!) 104/37 (!) 109/53 (!) 124/33  Pulse: 61 61 (!) 53 (!) 54  Resp: 17 15  17 15   Temp:   97.9 F (36.6 C)   TempSrc:   Oral   SpO2: 96% 94% 98% 97%  Weight:       CBC:  Recent Labs  Lab 11/07/20 2221 11/07/20 2226  WBC 7.7  --   NEUTROABS 5.8  --   HGB 13.2 14.3  HCT 43.6 42.0  MCV 97.3  --   PLT 253  --    Basic Metabolic Panel:  Recent Labs  Lab 11/07/20 2221 11/07/20 2226  NA 139 141  K 5.5* 5.3*  CL 104 106  CO2 23  --   GLUCOSE 150* 150*  BUN 39* 40*  CREATININE 3.33* 3.60*  CALCIUM 9.0  --     Lipid Panel:  Recent Labs  Lab 11/07/20 2221  CHOL 150  TRIG 185*  HDL 27*  CHOLHDL 5.6  VLDL 37  LDLCALC 86    HgbA1c:  Recent Labs  Lab 11/07/20 2221  HGBA1C 7.6*   Urine Drug Screen: No results for input(s): LABOPIA, COCAINSCRNUR, LABBENZ, AMPHETMU, THCU, LABBARB in the last 168 hours.  Alcohol Level No results for input(s): ETH in the last 168 hours.  IMAGING past 24 hours CT HEAD CODE STROKE WO CONTRAST  Result Date: 11/07/2020 CLINICAL DATA:  Code stroke. EXAM: CT HEAD WITHOUT CONTRAST TECHNIQUE: Contiguous axial images were obtained from the base of the skull through the vertex without intravenous contrast. COMPARISON:  10/26/2020. FINDINGS: Brain: New hypodensities in the right posterior limb of the internal  capsule (series 3, image 17) and right corona radiata (series 3, image 20), of indeterminate acuity. Redemonstrated hypodensity in the left PCA territory, right ACA territory, left medulla, and left cerebellum. Periventricular white matter changes, likely the sequela of chronic small vessel ischemic disease. Vascular: No hyperdense vessel. Skull: Normal. Negative for fracture or focal lesion. Sinuses/Orbits: No acute finding. Other: The mastoids are well aerated. ASPECTS [facility] Stroke Program Early CT Score) - Ganglionic level infarction (caudate, lentiform nuclei, internal capsule, insula, M1-M3 cortex): 6 - Supraganglionic infarction (M4-M6 cortex): 3 Total score (0-10 with 10 being normal): 9 IMPRESSION: 1. New  hypodensities in the right posterior limb of the internal capsule and right corona radiata, likely acute or subacute infarcts. No hemorrhage. 2. ASPECTS is 9. Code stroke imaging results were communicated on 11/07/2020 at 10:39 pm to provider [facility] via secure text paging. Electronically Signed   By: Wiliam Ke M.D.   On: 11/07/2020 22:42    PHYSICAL EXAM  Mental Status: Patient is awake, alert, oriented to person, place. Patient is unable to give a clear and coherent history. Speech fluent, intact comprehension and repetition. No signs of aphasia. Mild dysarthria however he is edentulous. Visual Fields right hemianopsia. Pupils are equal, round, and reactive to light. EOMI with head turn without ptosis.  Facial sensation is symmetric to temperature Facial movement is left asymmetric.  Hearing is intact to voice. Uvula midline and palate elevates symmetrically. Shoulder shrug is symmetric. Tongue is midline without atrophy or fasciculations.  Tone is normal. Bulk is normal.  Left upper extremity flexion 4/5; extension 3/5, left lower extremity internally rotated and flexed at the hip and flexed at the knee. Sensation is diminished on the left to light touch. Deep Tendon Reflexes: 2+ and symmetric in the biceps and patellae. Babinski positive left  FNF and HKS unable to perform. Gait - Deferred    ASSESSMENT/PLAN Mr. Randall [name] is a 75 y.o. male with history of  history significant for vascular dementia, history of CVA, atrial fibrillation on Eliquis who presents from skilled nursing facility with concerns of slurred speech and left arm weakness.   Patient alert and oriented only to self and answers no to every question.  History obtained from ED documentation ED physician.  Reportedly he was sent here by his facility for slurred vision and left arm weakness at 1830.  Last stroke in August with left cerebral infarct.  He also had high-grade stenosis of the left A2 ACA, right MA MCA  and left P2 and P3 PCA and also distal ACA and MCA branch stenosis bilaterally.  More recently, he has been evaluated several times this month for recurrent fall with negative work-up including CT head.    Neuroimaging done this admission shows 1. Acute to early subacute infarcts throughout the right MCA distribution primarily involving the corona radiata. 2. Remote infarcts in the left occipital lobe and left cerebellar hemisphere. 3. Critical stenosis of the right M1 segment with a trickle of flow lead enhancement traversing the stenosis, similar to the prior CTA head allowing for difference in modality. 4. Focal high-grade stenosis of the left A2 segment and multifocal stenosis with overall diminished enhancement in the left PCA are not significantly changed. 5. Patent vasculature of the neck with no high-grade stenosis, occlusion, dissection, or aneurysm.   ED Course: Presented as a code stroke and had findings of new hypodensity in the right posterior limb of the internal capsule and right corona radiata on CT head. CBC was unremarkable without  leukocytosis or anemia.  Sodium 141, potassium of 5.3, creatinine significantly elevated to 3.6 from prior of 1.43, BUN of 40, BG of 150.    Stroke :  right  mca territory infarct embolic secondary to  large vessel disease (MCA stenosis) in the setting of hypotension Code Stroke  1. New hypodensities in the right posterior limb of the internal capsule and right corona radiata, likely acute or subacute infarcts. No hemorrhage. 2. ASPECTS is 9.   MRI   brain:  1. Acute to early subacute infarcts throughout the right MCA distribution primarily involving the corona radiata. 2. Remote infarcts in the left occipital lobe and left cerebellar hemisphere. MRA   head and neck:  3. Critical stenosis of the right M1 segment with a trickle of flow lead enhancement traversing the stenosis, similar to the prior CTA head allowing for difference in  modality. 4. Focal high-grade stenosis of the left A2 segment and multifocal stenosis with overall diminished enhancement in the left PCA are not significantly changed. 5. Patent vasculature of the neck with no high-grade stenosis, occlusion, dissection, or aneurysm.  2D Echo  09/17/20 EF 55-60%, mod dilated LA, no IA shunt    LDL 86 HgbA1c 7.6 VTE prophylaxis - scd    Diet   Diet NPO time specified   Eliquis (apixaban) daily prior to admission, now on Eliquis (apixaban) daily.   Therapy recommendations:  pending Disposition:  snf  Hypertension Home meds:  carvedilol Stable Permissive hypertension (OK if < 220/120) but gradually normalize in 5-7 days Long-term BP goal normotensive  Hyperlipidemia Home meds:  lipitor,  resumed in hospital LDL 86, goal < 70   High intensity statin   Continue statin at discharge  Diabetes type II Controlled Home meds:  glipizide HgbA1c 7.6, goal < 7.0 CBGs Recent Labs    11/07/20 2225 11/08/20 0736  GLUCAP 151* 102*    SSI  Other Stroke Risk Factors  Advanced Age >/= 39   Hx stroke/TIA   Other Active Problems  AKI on CKD stage 2 -BUN of 40 with creatinine of 3.6 from prior of 1.43 -anticipate he will improve with continuous IV fluid hydration and follow repeat creatinine tomorrow -avoid nephrotoxic agent    Hyperkalemia -mild K of 5.3. Likely due to worsening renal insufficency.  -give Lokelma x 1    Type 2 diabetes -AIC of 8 in 08/2020 -place on low dose SSI    Vascular dementia -only oriented to self. Reportedly had behavior issues in the past    Hospital day # 0     To contact Stroke Continuity provider, please refer to [email or web address]. After hours, contact General Neurology

## 2020-11-08 NOTE — ED Notes (Signed)
Pt placed in mitts for safety from pulling at chords and IV.

## 2020-11-08 NOTE — Evaluation (Addendum)
Occupational Therapy Evaluation Patient Details Name: Randall [name] MRN: [id number] DOB: [birth year] Today's Date: 11/08/2020   History of Present Illness 75 yo male presenting to ED on 10/20 with slurred speech and left arm weakness. 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 PMH including vascular dementia, history of CVA, atrial fibrillation, and DM.   Clinical Impression   PTA, pt was living at [facility] and requiring assistance for ADLs; information from chart review. Pt currently requiring Total A for ADLs and bed mobility. Presenting with decreased cognition, balance, and safety. Pt with decreased engagement and keeping his eyes closed throughout evaluation. Stating simple sentences such as "I need to lay back down" and "May I have a sip of coffee". Pt would benefit from further acute OT to facilitate safe dc. Recommend dc to SNF for further OT to optimize safety, independence with ADLs, and return to PLOF.       Recommendations for follow up therapy are one component of a multi-disciplinary discharge planning process, led by the attending physician.  Recommendations may be updated based on patient status, additional functional criteria and insurance authorization.   Follow Up Recommendations  SNF    Equipment Recommendations  Other (comment) (Defer to next venue)    Recommendations for Other Services PT consult;Speech consult     Precautions / Restrictions Precautions Precautions: Fall      Mobility Bed Mobility Overal bed mobility: Needs Assistance Bed Mobility: Supine to Sit;Sit to Supine     Supine to sit: Total assist;+2 for physical assistance Sit to supine: Total assist;+2 for physical assistance   General bed mobility comments: Total A +2 for managing BLEs and trunk.    Transfers                 General transfer comment: Defer due to lethargic and safety    Balance Overall balance assessment: Needs assistance Sitting-balance  support: No upper extremity supported;Feet supported Sitting balance-Leahy Scale: Zero                                     ADL either performed or assessed with clinical judgement   ADL Overall ADL's : Needs assistance/impaired                                       General ADL Comments: Total A for ADLs and bed mobility     Vision         Perception     Praxis      Pertinent Vitals/Pain Pain Assessment: Faces Faces Pain Scale: No hurt Pain Intervention(s): Monitored during session     Hand Dominance     Extremity/Trunk Assessment Upper Extremity Assessment Upper Extremity Assessment: Difficult to assess due to impaired cognition;RUE deficits/detail;LUE deficits/detail RUE Deficits / Details: WFL for PROM. sponanteously moving UEs LUE Deficits / Details: WFL for PROM. sponanteously moving UEs   Lower Extremity Assessment Lower Extremity Assessment: Defer to PT evaluation   Cervical / Trunk Assessment Cervical / Trunk Assessment: Kyphotic   Communication     Cognition Arousal/Alertness: Lethargic;Suspect due to medications (Received Ativan for MRI) Behavior During Therapy: Flat affect (Keeping eye closed) Overall Cognitive Status: Difficult to assess  General Comments: Keeping eyes closed. Not engaging in activity or conversation. Stating simple sentences such as "I need to kay back down" or "Can I have a sip of coffee".   General Comments  VSS on RA    Exercises     Shoulder Instructions      Home Living Family/patient expects to be discharged to:: Skilled nursing facility                                 Additional Comments: [facility] chart [facility]      Prior Functioning/Environment Level of Independence: Needs assistance        Comments: Per chart review, used a RW and required assistance for ADLs        OT Problem List: Decreased  strength;Decreased range of motion;Decreased activity tolerance;Decreased cognition;Decreased safety awareness;Decreased knowledge of use of DME or AE;Decreased knowledge of precautions      OT Treatment/Interventions: Self-care/ADL training;Therapeutic exercise;Energy conservation;DME and/or AE instruction;Therapeutic activities;Patient/family education    OT Goals(Current goals can be found in the care plan section) Acute Rehab OT Goals Patient Stated Goal: "May I have a sip of coffee?" OT Goal Formulation: Patient unable to participate in goal setting Time For Goal Achievement: 11/22/20 Potential to Achieve Goals: Good  OT Frequency: Min 2X/week   Barriers to D/C:            Co-evaluation PT/OT/SLP Co-Evaluation/Treatment: Yes Reason for Co-Treatment: For patient/therapist safety;To address functional/ADL transfers   OT goals addressed during session: ADL's and self-care      AM-PAC OT "6 Clicks" Daily Activity     Outcome Measure Help from another person eating meals?: Total Help from another person taking care of personal grooming?: Total Help from another person toileting, which includes using toliet, bedpan, or urinal?: Total Help from another person bathing (including washing, rinsing, drying)?: Total Help from another person to put on and taking off regular upper body clothing?: Total Help from another person to put on and taking off regular lower body clothing?: Total 6 Click Score: 6   End of Session    Activity Tolerance: Patient limited by lethargy Patient left: in bed;with call bell/phone within reach  OT Visit Diagnosis: Unsteadiness on feet (R26.81);Other abnormalities of gait and mobility (R26.89);Muscle weakness (generalized) (M62.81)                Time: 1610-9604 OT Time Calculation (min): 9 min Charges:  OT General Charges $OT Visit: 1 Visit OT Evaluation $OT Eval Moderate Complexity: 1 Mod  Myanna Ziesmer MSOT, OTR/L Acute Rehab Pager:  [phone] Office: [phone]  Theodoro Grist Wally Behan 11/08/2020, 11:26 AM

## 2020-11-08 NOTE — Progress Notes (Signed)
  Echocardiogram 2D Echocardiogram has been performed.  Delcie Roch 11/08/2020, 3:21 PM

## 2020-11-08 NOTE — ED Notes (Signed)
Randall [name] daughter in law [phone] requesting an update

## 2020-11-08 NOTE — Progress Notes (Signed)
Per report from ED RN patient lethargic dt Ativan being given and NIH unable to be completed.   Attempted NIH when patient arrived to unit after echo was completed at 1530 and patient still unable to complete NIH dt lethargy. Attempted to perform NIH again at 1630 and patient still drowsy and unable to complete.

## 2020-11-08 NOTE — ED Notes (Signed)
Patient remains very drowsy/sleeping, awakens briefly but goes back to sleep quickly. Respirations remains even and unlabored. No distress.

## 2020-11-08 NOTE — Progress Notes (Signed)
PT Evaluation   11/08/20 1142  PT Visit Information  Last PT Received On 11/08/20  Assistance Needed +2  PT/OT/SLP Co-Evaluation/Treatment Yes  Reason for Co-Treatment For patient/therapist safety;To address functional/ADL transfers  PT goals addressed during session Mobility/safety with mobility;Balance  History of Present Illness 75 yo male presenting to ED on 10/20 with slurred speech and left arm weakness. 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 PMH including vascular dementia, history of CVA, atrial fibrillation, and DM.  Precautions  Precautions Fall  Restrictions  Weight Bearing Restrictions No  Home Living  Family/patient expects to be discharged to: Skilled nursing facility  Additional Comments Per chart review, from [facility]  Prior Function  Level of Independence Needs assistance  Comments Per chart review, used a RW and required assistance for ADLs  Communication  Communication Expressive difficulties;Other (comment) (mumbled speech secondary to lethargy)  Pain Assessment  Pain Assessment Faces  Faces Pain Scale 0  Cognition  Arousal/Alertness Lethargic;Suspect due to medications (Received Ativan for MRI)  Behavior During Therapy Flat affect (Keeping eye closed)  Overall Cognitive Status Difficult to assess  General Comments Keeping eyes closed. Not engaging in activity or conversation. Stating simple sentences such as "I need to lay back down" or "Can I have a sip of coffee".  Difficult to assess due to Level of arousal  Upper Extremity Assessment  Upper Extremity Assessment Defer to OT evaluation  Lower Extremity Assessment  Lower Extremity Assessment RLE deficits/detail;LLE deficits/detail  RLE Deficits / Details Spontaneous movement of BLE. Tended to keep in hip and knee flexion, but would extend occasionally as well. Pt somewhat resistive to PROM attempts.  LLE Deficits / Details Spontaneous movement of BLE. Tended to keep in hip and knee  flexion, but would extend occasionally as well. Pt somewhat resistive to PROM attempts.  Cervical / Trunk Assessment  Cervical / Trunk Assessment Kyphotic  Bed Mobility  Overal bed mobility Needs Assistance  Bed Mobility Supine to Sit;Sit to Supine  Supine to sit Total assist;+2 for physical assistance  Sit to supine Total assist;+2 for physical assistance  General bed mobility comments Total A +2 for managing BLEs and trunk. Pt mumbling some words, but too lethargic to perform further mobility tasks.  Transfers  General transfer comment Defer due to lethargic and safety  Balance  Overall balance assessment Needs assistance  Sitting-balance support No upper extremity supported;Feet supported  Sitting balance-Leahy Scale Zero  General Comments  General comments (skin integrity, edema, etc.) VSS on RA  PT - End of Session  Equipment Utilized During Treatment Gait belt  Activity Tolerance Patient limited by lethargy  Patient left in bed;with call bell/phone within reach (on stretcher in ED)  Nurse Communication Mobility status  PT Assessment  PT Recommendation/Assessment Patient needs continued PT services  PT Visit Diagnosis Unsteadiness on feet (R26.81);Muscle weakness (generalized) (M62.81);Difficulty in walking, not elsewhere classified (R26.2)  PT Problem List Decreased strength;Decreased activity tolerance;Decreased balance;Decreased coordination;Decreased mobility;Decreased cognition;Decreased knowledge of use of DME;Decreased safety awareness;Decreased knowledge of precautions  PT Plan  PT Frequency (ACUTE ONLY) Min 2X/week  PT Treatment/Interventions (ACUTE ONLY) Gait training;DME instruction;Functional mobility training;Therapeutic exercise;Therapeutic activities;Balance training;Patient/family education  AM-PAC PT "6 Clicks" Mobility Outcome Measure (Version 2)  Help needed turning from your back to your side while in a flat bed without using bedrails? 1  Help needed moving  from lying on your back to sitting on the side of a flat bed without using bedrails? 1  Help needed moving to and  from a bed to a chair (including a wheelchair)? 1  Help needed standing up from a chair using your arms (e.g., wheelchair or bedside chair)? 1  Help needed to walk in hospital room? 1  Help needed climbing 3-5 steps with a railing?  1  6 Click Score 6  Consider Recommendation of Discharge To: CIR/SNF/LTACH  Progressive Mobility  What is the highest level of mobility based on the progressive mobility assessment? Level 1 (Bedfast) - Unable to balance while sitting on edge of bed  Mobility Sit up in bed/chair position for meals  PT Recommendation  Follow Up Recommendations SNF;Supervision/Assistance - 24 hour  PT equipment Other (comment) (TBD)  Individuals Consulted  Consulted and Agree with Results and Recommendations Patient unable/family or caregiver not available  Acute Rehab PT Goals  Patient Stated Goal "May I have a sip of coffee?"  PT Goal Formulation With patient  Time For Goal Achievement 11/22/20  Potential to Achieve Goals Fair  PT Time Calculation  PT Start Time (ACUTE ONLY) 1006  PT Stop Time (ACUTE ONLY) 1017  PT Time Calculation (min) (ACUTE ONLY) 11 min  PT General Charges  $$ ACUTE PT VISIT 1 Visit  PT Evaluation  $PT Eval Moderate Complexity 1 Mod   Pt admitted secondary to problem above with deficits above. Pt very lethargic throughout, however, was able to state "I want to lay back down", and "can I have some coffee?" Pt requiring total A +2 for bed mobility and only briefly opened eyes. Recommending return to SNF at d/c with therapy follow up, as per notes, pt was previously able to ambulate with assist using RW. Will continue to follow acutely.   Farley Ly, PT, DPT  Acute Rehabilitation Services  Pager: [phone] Office: [phone]

## 2020-11-08 NOTE — ED Notes (Signed)
Transport came for MRI at approx. 3435.  I reached out to admitting for Ativan order because pt isnt likely to stay still without it.  Transport called MRI who stated that the scan takes 45 min and she leaves at 7a.  I was still waiting for return call from admitting at the time so transport left. Admitting called shortly after transport left and we now have order for 1mg  of ativan.  Follow up call made to MRI.  They informed me they hope to be able to get him shortly after 7am.

## 2020-11-08 NOTE — Progress Notes (Signed)
SLP Cancellation Note  Patient Details Name: Randall [name] MRN: [id number] DOB: 03/02/1945   Cancelled treatment:       Reason Eval/Treat Not Completed: Fatigue/lethargy limiting ability to participate. Reached out to RN who reports continued lethargy impacting pt ability to complete bedside swallow eval at this time. Will continue efforts.    Avie Echevaria, MA, CCC-SLP Acute Rehabilitation Services Office Number: [phone]  Paulette Blanch 11/08/2020, 12:30 PM

## 2020-11-08 NOTE — ED Notes (Signed)
PT at bedside, patient remains very drowsy.

## 2020-11-08 NOTE — ED Triage Notes (Signed)
Pt BIB [facility] as a Code Stroke from [facility].  LKW was 1830.  Pt discovered to have rleft sided arm weakness and worsening slurred speech at 2100.  EMS reports he has been declining with failure to thrive since Monday.   Pt has hx of previous stroke with right sided deficits.  He normally walks with a walker but is prone to falls.  He was reportedly taken off of Eliquis a week ago but his MAR seems to indicate he received it today.   EMS reports he is normally alert with some short term memory deficits.

## 2020-11-08 NOTE — ED Notes (Signed)
Patient transported to MRI 

## 2020-11-09 ENCOUNTER — Inpatient Hospital Stay (HOSPITAL_COMMUNITY): Payer: Medicare Other

## 2020-11-09 DIAGNOSIS — E1122 Type 2 diabetes mellitus with diabetic chronic kidney disease: Secondary | ICD-10-CM | POA: Diagnosis not present

## 2020-11-09 DIAGNOSIS — I63511 Cerebral infarction due to unspecified occlusion or stenosis of right middle cerebral artery: Secondary | ICD-10-CM | POA: Diagnosis not present

## 2020-11-09 DIAGNOSIS — F01511 Vascular dementia, unspecified severity, with agitation: Secondary | ICD-10-CM | POA: Diagnosis not present

## 2020-11-09 DIAGNOSIS — E875 Hyperkalemia: Secondary | ICD-10-CM | POA: Diagnosis not present

## 2020-11-09 DIAGNOSIS — R4182 Altered mental status, unspecified: Secondary | ICD-10-CM

## 2020-11-09 LAB — COMPREHENSIVE METABOLIC PANEL
ALT: 13 U/L (ref 0–44)
AST: 21 U/L (ref 15–41)
Albumin: 3.1 g/dL — ABNORMAL LOW (ref 3.5–5.0)
Alkaline Phosphatase: 93 U/L (ref 38–126)
Anion gap: 9 (ref 5–15)
BUN: 34 mg/dL — ABNORMAL HIGH (ref 8–23)
CO2: 24 mmol/L (ref 22–32)
Calcium: 8.7 mg/dL — ABNORMAL LOW (ref 8.9–10.3)
Chloride: 109 mmol/L (ref 98–111)
Creatinine, Ser: 2.23 mg/dL — ABNORMAL HIGH (ref 0.61–1.24)
GFR, Estimated: 30 mL/min — ABNORMAL LOW (ref >60)
Glucose, Bld: 86 mg/dL (ref 70–99)
Potassium: 4.7 mmol/L (ref 3.5–5.1)
Sodium: 142 mmol/L (ref 135–145)
Total Bilirubin: 0.7 mg/dL (ref 0.3–1.2)
Total Protein: 6.5 g/dL (ref 6.5–8.1)

## 2020-11-09 LAB — CBC WITH DIFFERENTIAL/PLATELET
Abs Immature Granulocytes: 0.03 10*3/uL (ref 0.00–0.07)
Basophils Absolute: 0 10*3/uL (ref 0.0–0.1)
Basophils Relative: 1 %
Eosinophils Absolute: 0.1 10*3/uL (ref 0.0–0.5)
Eosinophils Relative: 2 %
HCT: 41.1 % (ref 39.0–52.0)
Hemoglobin: 12.5 g/dL — ABNORMAL LOW (ref 13.0–17.0)
Immature Granulocytes: 1 %
Lymphocytes Relative: 19 %
Lymphs Abs: 1.2 10*3/uL (ref 0.7–4.0)
MCH: 28.9 pg (ref 26.0–34.0)
MCHC: 30.4 g/dL (ref 30.0–36.0)
MCV: 95.1 fL (ref 80.0–100.0)
Monocytes Absolute: 0.8 10*3/uL (ref 0.1–1.0)
Monocytes Relative: 13 %
Neutro Abs: 4.2 10*3/uL (ref 1.7–7.7)
Neutrophils Relative %: 64 %
Platelets: 224 10*3/uL (ref 150–400)
RBC: 4.32 MIL/uL (ref 4.22–5.81)
RDW: 14.1 % (ref 11.5–15.5)
WBC: 6.4 10*3/uL (ref 4.0–10.5)
nRBC: 0 % (ref 0.0–0.2)

## 2020-11-09 LAB — MAGNESIUM: Magnesium: 2.4 mg/dL (ref 1.7–2.4)

## 2020-11-09 LAB — GLUCOSE, CAPILLARY
Glucose-Capillary: 79 mg/dL (ref 70–99)
Glucose-Capillary: 83 mg/dL (ref 70–99)
Glucose-Capillary: 97 mg/dL (ref 70–99)
Glucose-Capillary: 108 mg/dL — ABNORMAL HIGH (ref 70–99)

## 2020-11-09 LAB — BLOOD GAS, ARTERIAL
Acid-base deficit: 3 mmol/L — ABNORMAL HIGH (ref 0.0–2.0)
Bicarbonate: 20.7 mmol/L (ref 20.0–28.0)
Drawn by: 336832
FIO2: 21
O2 Saturation: 98 %
Patient temperature: 37
pCO2 arterial: 32.4 mmHg (ref 32.0–48.0)
pH, Arterial: 7.421 (ref 7.350–7.450)
pO2, Arterial: 104 mmHg (ref 83.0–108.0)

## 2020-11-09 LAB — TSH: TSH: 0.715 u[IU]/mL (ref 0.350–4.500)

## 2020-11-09 LAB — PHOSPHORUS: Phosphorus: 3.3 mg/dL (ref 2.5–4.6)

## 2020-11-09 LAB — AMMONIA: Ammonia: 19 umol/L (ref 9–35)

## 2020-11-09 LAB — VITAMIN B12: Vitamin B-12: 1192 pg/mL — ABNORMAL HIGH (ref 180–914)

## 2020-11-09 NOTE — Progress Notes (Signed)
Patient A+Ox1, but when asked to complete NIH and Neuro assessments, responds "No". Unable to complete accurate assessments dt patient refusal. MD aware.

## 2020-11-09 NOTE — Evaluation (Signed)
Clinical/Bedside Swallow Evaluation Patient Details  Name: Randall [name] MRN: [id number] Date of Birth: [birth year]  Today's Date: 11/09/2020 Time: SLP Start Time (ACUTE ONLY): 1458 SLP Stop Time (ACUTE ONLY): 1512 SLP Time Calculation (min) (ACUTE ONLY): 14 min  Past Medical History:  Past Medical History:  Diagnosis Date   Atrial fibrillation (HCC)    CVA (cerebral vascular accident) (HCC)    DM (diabetes mellitus) (HCC)    HTN (hypertension)    Past Surgical History: No past surgical history on file. HPI:  75 yo male presenting to ED on 10/20 with slurred speech and left arm weakness. 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 PMH including vascular dementia, history of CVA, atrial fibrillation, and DM.    Assessment / Plan / Recommendation  Clinical Impression  Pt alert, though somewhat lethargic, seen for bedside swallow eval. Oral mechanism examination significant for L labial asymmetry and lingual deviation to the L, which primarily resulted in anterior spillage of liquid POs without straw. Thin liquids consumed in large volumes via cup and straw were significant for audible swallows and some concern for delay in swallow initation, however no overt s/sx of aspiration noted initially. After administration of solid POs, pt requested more thin liquids with mild wet vocal quality and excessive overt coughing exhibited, concerning for reduced airway protection.  NTL via straw eliminated s/sx of aspiration, except for x1 throat clear. Pureed and regular textured solids orally cleared and were without s/sx of aspiration, although mastication of regular textures was effortful and prolonged. Given recent CVA and clinical indicators concerning for oropharyngeal dysphagia, recommend initation of dys 1/NTL diet with f/u instrumental assessment to further assess swallow physiology. Likely to be scheduled and completed early next week due to limited availability in radiology over the  weekends.  SLP Visit Diagnosis: Dysphagia, unspecified (R13.10)    Aspiration Risk  Moderate aspiration risk    Diet Recommendation Dysphagia 1 (Puree);Nectar-thick liquid   Liquid Administration via: Straw;Cup Medication Administration: Crushed with puree Supervision: Staff to assist with self feeding;Full supervision/cueing for compensatory strategies Compensations: Minimize environmental distractions;Slow rate;Small sips/bites Postural Changes: Seated upright at 90 degrees    Other  Recommendations Oral Care Recommendations: Oral care BID;Staff/trained caregiver to provide oral care    Recommendations for follow up therapy are one component of a multi-disciplinary discharge planning process, led by the attending physician.  Recommendations may be updated based on patient status, additional functional criteria and insurance authorization.  Follow up Recommendations Skilled Nursing facility      Frequency and Duration min 2x/week  2 weeks       Prognosis Prognosis for Safe Diet Advancement: Good Barriers to Reach Goals: Cognitive deficits;Time post onset      Swallow Study   General Date of Onset: 11/07/20 HPI: 75 yo male presenting to ED on 10/20 with slurred speech and left arm weakness. 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 PMH including vascular dementia, history of CVA, atrial fibrillation, and DM. Type of Study: Bedside Swallow Evaluation Previous Swallow Assessment: none per EMR Diet Prior to this Study: NPO Temperature Spikes Noted: No Respiratory Status: Room air History of Recent Intubation: No Behavior/Cognition: Alert;Confused;Uncooperative;Requires cueing Oral Cavity Assessment: Within Functional Limits Oral Care Completed by SLP: No Oral Cavity - Dentition: Missing dentition;Poor condition Vision: Functional for self-feeding Self-Feeding Abilities: Able to feed self;Needs assist Patient Positioning: Upright in bed;Postural control  interferes with function Baseline Vocal Quality: Normal Volitional Cough: Weak Volitional Swallow: Unable to elicit  Oral/Motor/Sensory Function Overall Oral Motor/Sensory Function: Mild impairment Facial ROM: Reduced left;Suspected CN VII (facial) dysfunction Facial Symmetry: Abnormal symmetry left;Suspected CN VII (facial) dysfunction Lingual ROM: Within Functional Limits Lingual Symmetry: Abnormal symmetry left;Suspected CN XII (hypoglossal) dysfunction   Ice Chips Ice chips: Within functional limits Presentation: Spoon   Thin Liquid Thin Liquid: Impaired Presentation: Cup;Straw;Self Fed Oral Phase Impairments: Reduced labial seal Oral Phase Functional Implications: Left anterior spillage Pharyngeal  Phase Impairments: Cough - Delayed;Suspected delayed Swallow;Wet Vocal Quality    Nectar Thick Nectar Thick Liquid: Impaired Presentation: Self Fed;Straw Pharyngeal Phase Impairments: Throat Clearing - Delayed   Honey Thick Honey Thick Liquid: Within functional limits Presentation: Cup   Puree Puree: Within functional limits Presentation: Spoon   Solid     Solid: Impaired Presentation: Self Fed Oral Phase Impairments: Impaired mastication;Reduced labial seal Oral Phase Functional Implications: Impaired mastication     Avie Echevaria, MA, CCC-SLP Acute Rehabilitation Services Office Number: [phone]  Paulette Blanch 11/09/2020,3:59 PM

## 2020-11-09 NOTE — Procedures (Signed)
Patient Name: Randall [name]  MRN: [id number]  Epilepsy Attending: Charlsie Quest  Referring Physician/Provider: Dr Haig Prophet Date: 11/09/2020 Duration: 21.24 mins  Patient history: 75yo M with dementia, acute Right MCA infarct with ams. EEG to evaluate for seizure.  Level of alertness: Awake  AEDs during EEG study: None  Technical aspects: This EEG study was done with scalp electrodes positioned according to the 10-20 International system of electrode placement. Electrical activity was acquired at a sampling rate of 500Hz  and reviewed with a high frequency filter of 70Hz  and a low frequency filter of 1Hz . EEG data were recorded continuously and digitally stored.   Description: The posterior dominant rhythm consists of 8 Hz activity of moderate voltage (25-35 uV) seen predominantly in posterior head regions, symmetric and reactive to eye opening and eye closing. EEG showed continuous 3 to 6 Hz theta-delta slowing in right temporal region.  Hyperventilation and photic stimulation were not performed.     Of note, this study was technically difficult due to significant movement artifact.  ABNORMALITY - Continuous slow, right temporal region.  IMPRESSION: This technically difficult study is suggestive of cortical dysfunction arising from right temporal region. likely secondary to underlying structural abnormality/stroke. No seizures or epileptiform discharges were seen throughout the recording.  Trevino Wyatt 

## 2020-11-09 NOTE — Progress Notes (Signed)
EEG complete - results pending 

## 2020-11-09 NOTE — Progress Notes (Signed)
PROGRESS NOTE    Randall [name]  ONG:[id number] DOB: 1945/11/02 DOA: 11/07/2020 PCP: Ashley Royalty, NP  Brief Narrative:  The patient is a 75 year old overweight chronically ill-appearing Caucasian male with a past medical history significant for but not limited to vascular dementia, history of CVA, atrial fibrillation on anticoagulation with Eliquis as well as other comorbidities including diabetes mellitus type 2 and hypertension who presented with slurred speech and left arm weakness from his SNF.  He is only alert and oriented to self and only his answers no to every question on admission.  History is obtained from the ED documentation as well as ED physician initially and he was reportedly sent here for slurred speech and left arm weakness that happened around 630 the night before.  Last shock was in August with a left cerebral infarct.  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  He was evaluated several times this month for recurrent falls with negative work-up including CT head.  He presented as a code stroke and had findings of a new hypodensity in the right posterior limb of the internal capsule and right corona radiata on CT head.  He underwent further work-up and neurology was evaluated and recommended further stroke work-up.  Assessment & Plan:   Principal Problem:   CVA (cerebral vascular accident) [facility]) Active Problems:   Hyperkalemia   Type 2 diabetes mellitus (HCC)   Vascular dementia (HCC)  Acute CVA with right MCA territory infarcts infarct embolic secondary large vessel disease in the setting of hypotension -Neurology consulted and he had code stroke CT scan which showed new hypodensity in the right posterior limb of internal capsule and right corona radiata likely acute or subacute infarcts there is no hemorrhage -MRI of the brain showed a acute early subacute  infarcts of the right MCA distribution primary involving the right corona radiata and remote infarcts in the left occipital lobe and some cerebellar hemisphere -MRI of the head and neck done and showed critical stenosis of right M1 segment with a trickle of flow of lead and has been transversing the stenosis similar to prior to CT a head allowing for differences in modality as well as focal high-grade stenosis of the left A2 segment and multifocal stenosis with overall diminished enhancement in the left PCA that was not significantly changed.  Patient also had a vasculature of the neck with no high-grade stenosis occlusion or dissection or aneurysm -Echocardiogram done a few months ago showed an EF of 55 to 60%, moderate dilated left atrium and no IA shunt -Lipid panel done and hemoglobin A1c obtained; lipid panel showed a total cholesterol/HDL panel of 5.6, cholesterol level 150, HDL of 27, LDL of 86, triglycerides of 185, and VLDL of 37 -Hemoglobin A1c was 7.6 -Patient was n.p.o. given his diet but now after SLP evaluation he has been placed on a dysphagia 1 diet with nectar thick liquids -PT OT and SLP consulted -Continue with frequent neurochecks and keep on telemetry -Per neurology the goal blood pressure is between 1 30-1 60 systolic given severe intracranial stenosis -We will resume his Eliquis and statin now that SLP recommends dysphagia 1 diet with nectar thick liquids -PT OT recommending SNF -Palliative care has been consulted for goals of care discussion given his vascular dementia and recurrent strokes  Hypertension -Neurology recommending goal blood pressure to be between 1 30-1 60  given severe intracranial stenosis -He is on carvedilol at home -Continue to monitor blood pressure per protocol -Last blood pressure reading was  Hyperlipidemia -Lipid panel as above -Continue with atorvastatin 40 mg p.o. daily  Diabetes Mellitus Type 2 -Takes glipizide at the SNF -Recent hemoglobin  A1c was 7.6 this admission and prior 1 was 8 and 08/2020 -Continue with sensitive Lovenox SQ insulin AC -Continue monitor CBGs per protocol  AKI on CKD stage 2 -BUNs/creatinine is now 34/2.23 -Continue with IV fluid hydration with normal saline at 75 MLS per hour -Avoid nephrotoxic medications, contrast dyes, hypotension renally dose medications -Repeat CMP in a.m.  Normocytic anemia/anemia chronic disease -His hemoglobin/hematocrit went from 14.3/42.0 and then trended down to 12.5/41.1 -Check anemia panel in a.m. -Continue to monitor for signs and symptoms bleeding; currently no overt bleeding noted -Repeat CBC in a.m.   Hyperkalemia -mild K of 5.3-->4.8 and today it was 4.7 -given Lokelma x 1 and is improved -Continue monitor and trend and repeat CMP in  Vascular Dementia with behavioral disturbances and lethargy and refusal of care -Only oriented to self. Reportedly had behavior issues in the past and has been lethargic today and not really interactive and refusing care -Palliative care has been consulted for further goals of care discussion -Has mitten restraints -Placed on delirium precautions  DVT prophylaxis: Apixaban Code Status: DO NOT RESUSCITATE  Family Communication: No family present at bedside Disposition Plan: Pending further clinical improvement and evaluation and discussion with palliative care  Status is: Inpatient  Remains inpatient appropriate because: New CVA and will need palliative care discussion for further goals of care  Consultants:  Neurology  Procedures:  ECHOCARDIOGRAM IMPRESSIONS     1. Left ventricular ejection fraction, by estimation, is 60 to 65%. The  left ventricle has normal function. The left ventricle demonstrates  regional wall motion abnormalities (see scoring diagram/findings for  description). There is moderate left  ventricular hypertrophy. Left ventricular diastolic function could not be  evaluated. There is akinesis of the  left ventricular, basal-mid  inferolateral wall.   2. Right ventricular systolic function is mildly reduced. The right  ventricular size is normal.   3. The mitral valve is normal in structure. Trivial mitral valve  regurgitation. No evidence of mitral stenosis.   4. The aortic valve is tricuspid. Aortic valve regurgitation is not  visualized. No aortic stenosis is present.   FINDINGS   Left Ventricle: Left ventricular ejection fraction, by estimation, is 60  to 65%. The left ventricle has normal function. The left ventricle  demonstrates regional wall motion abnormalities. The left ventricular  internal cavity size was normal in size.  There is moderate left ventricular hypertrophy. Left ventricular diastolic  function could not be evaluated.   Right Ventricle: The right ventricular size is normal. Right ventricular  systolic function is mildly reduced.   Left Atrium: Left atrial size was normal in size.   Right Atrium: Right atrial size was normal in size.   Pericardium: There is no evidence of pericardial effusion.   Mitral Valve: The mitral valve is normal in structure. Mild mitral annular  calcification. Trivial mitral valve regurgitation. No evidence of mitral  valve stenosis.   Tricuspid Valve: The tricuspid valve is normal in structure. Tricuspid  valve regurgitation is mild . No evidence of tricuspid stenosis.   Aortic Valve: The aortic valve is tricuspid. Aortic valve regurgitation is  not visualized. No aortic stenosis is present.   Pulmonic Valve: The pulmonic valve was not assessed.  Aorta: The aortic root is normal in size and structure.   Venous: The inferior vena cava was not well visualized.   IAS/Shunts: No atrial level shunt detected by color flow Doppler.   LEFT VENTRICLE  PLAX 2D  LVIDd:         4.80 cm  LVIDs:         3.70 cm  LV PW:         1.30 cm  LV IVS:        1.80 cm  LVOT diam:     2.10 cm  LVOT Area:     3.46 cm      LEFT ATRIUM          Index  LA diam:    4.40 cm 2.21 cm/m   MITRAL VALVE                TRICUSPID VALVE  MV Area (PHT): 2.54 cm     TR Peak grad:   24.2 mmHg  MV Decel Time: 299 msec     TR Vmax:        246.00 cm/s  MV E velocity: 67.90 cm/s  MV A velocity: 128.00 cm/s  SHUNTS  MV E/A ratio:  0.53         Systemic Diam: 2.10 cm   Antimicrobials:  Anti-infectives (From admission, onward)    None        Subjective: Seen and examined at bedside and he was only alert and oriented to himself and was a little lethargic but easily arousable.  Had a soft mitten restraint on and was very confused.  Did not want to eat.  SLP evaluated and recommending dysphagia 1 diet with nectar thick liquids.  Unable to obtain a proper subjective history given the patient's confusion and dementia.  Objective: Vitals:   11/09/20 0424 11/09/20 0754 11/09/20 1118 11/09/20 1552  BP: (!) 153/65 (!) 142/98 (!) 158/72 (!) 136/49  Pulse:  71 70 67  Resp:   16 16  Temp:  97.6 F (36.4 C) 97.7 F (36.5 C)   TempSrc:  Oral Oral   SpO2:  100% 97% 99%  Weight:        Intake/Output Summary (Last 24 hours) at 11/09/2020 1738 Last data filed at 11/09/2020 1300 Gross per 24 hour  Intake 946.25 ml  Output 900 ml  Net 46.25 ml   Filed Weights   11/07/20 2228  Weight: 81.6 kg   Examination: Physical Exam:  Constitutional: WN/WD overweight demented Caucasian male in NAD and appears a little agitated Eyes: Lids and conjunctivae normal, sclerae anicteric  ENMT: External Ears, Nose appear normal. Grossly normal hearing. Mucous membranes are moist Neck: Appears normal, supple, no cervical masses, normal ROM, no appreciable thyromegaly; no JVD Respiratory: Diminished to auscultation bilaterally, no wheezing, rales, rhonchi or crackles. Normal respiratory effort and patient is not tachypenic. No accessory muscle use. Unlabored breathing  Cardiovascular: RRR, no murmurs / rubs / gallops. S1 and S2 auscultated.  Abdomen: Soft,  non-tender, Distended 2/2 to body habitus. Bowel sounds positive.  GU: Deferred. Musculoskeletal: No clubbing / cyanosis of digits/nails. No joint deformity upper and lower extremities Skin: No rashes, lesions, ulcers on a limited skin evaluation. No induration; Warm and dry.  Neurologic: CN 2-12 grossly intact with no focal deficits. Romberg sign and cerebellar reflexes not assessed.  Psychiatric: Impaired judgment and insight. Alert and oriented x1. Agitated mood  Data Reviewed: I have personally reviewed following labs and imaging studies  CBC:  Recent Labs  Lab 11/07/20 2221 11/07/20 2226 11/09/20 0930  WBC 7.7  --  6.4  NEUTROABS 5.8  --  4.2  HGB 13.2 14.3 12.5*  HCT 43.6 42.0 41.1  MCV 97.3  --  95.1  PLT 253  --  224   Basic Metabolic Panel: Recent Labs  Lab 11/07/20 2221 11/07/20 2226 11/08/20 1010 11/09/20 0201 11/09/20 0930  NA 139 141 140 142  --   K 5.5* 5.3* 4.8 4.7  --   CL 104 106 108 109  --   CO2 23  --  22 24  --   GLUCOSE 150* 150* 102* 86  --   BUN 39* 40* 38* 34*  --   CREATININE 3.33* 3.60* 3.03* 2.23*  --   CALCIUM 9.0  --  8.4* 8.7*  --   MG  --   --   --   --  2.4  PHOS  --   --   --   --  3.3   GFR: Estimated Creatinine Clearance: 28.6 mL/min (A) (by C-G formula based on SCr of 2.23 mg/dL (H)). Liver Function Tests: Recent Labs  Lab 11/07/20 2221 11/08/20 1010 11/09/20 0201  AST 20 20 21   ALT 12 10 13   ALKPHOS 102 87 93  BILITOT 0.5 0.6 0.7  PROT 7.1 6.1* 6.5  ALBUMIN 3.4* 2.9* 3.1*   No results for input(s): LIPASE, AMYLASE in the last 168 hours. Recent Labs  Lab 11/09/20 0930  AMMONIA 19   Coagulation Profile: Recent Labs  Lab 11/07/20 2221  INR 1.7*   Cardiac Enzymes: No results for input(s): CKTOTAL, CKMB, CKMBINDEX, TROPONINI in the last 168 hours. BNP (last 3 results) No results for input(s): PROBNP in the last 8760 hours. HbA1C: Recent Labs    11/07/20 2221  HGBA1C 7.6*   CBG: Recent Labs  Lab  11/08/20 1809 11/08/20 2158 11/09/20 0630 11/09/20 1118 11/09/20 1551  GLUCAP 88 83 79 83 97   Lipid Profile: Recent Labs    11/07/20 2221  CHOL 150  HDL 27*  LDLCALC 86  TRIG 11/11/20*  CHOLHDL 5.6   Thyroid Function Tests: Recent Labs    11/09/20 0930  TSH 0.715   Anemia Panel: Recent Labs    11/09/20 0930  VITAMINB12 1,192*   Sepsis Labs: No results for input(s): PROCALCITON, LATICACIDVEN in the last 168 hours.  Recent Results (from the past 240 hour(s))  Resp Panel by RT-PCR (Flu A&B, Covid) Nasopharyngeal Swab     Status: None   Collection Time: 11/08/20  2:06 AM   Specimen: Nasopharyngeal Swab; Nasopharyngeal(NP) swabs in vial transport medium  Result Value Ref Range Status   SARS Coronavirus 2 by RT PCR NEGATIVE NEGATIVE Final    Comment: (NOTE) SARS-CoV-2 target nucleic acids are NOT DETECTED.  The SARS-CoV-2 RNA is generally detectable in upper respiratory specimens during the acute phase of infection. The lowest concentration of SARS-CoV-2 viral copies this assay can detect is 138 copies/mL. A negative result does not preclude SARS-Cov-2 infection and should not be used as the sole basis for treatment or other patient management decisions. A negative result may occur with  improper specimen collection/handling, submission of specimen other than nasopharyngeal swab, presence of viral mutation(s) within the areas targeted by this assay, and inadequate number of viral copies(<138 copies/mL). A negative result must be combined with clinical observations, patient history, and epidemiological information. The expected result is Negative.  Fact Sheet for Patients:  11/11/20  Fact Sheet for Healthcare  Providers:  SeriousBroker.it  This test is no t yet approved or cleared by the [place] and  has been authorized for detection and/or diagnosis of SARS-CoV-2 by FDA under an Emergency Use  Authorization (EUA). This EUA will remain  in effect (meaning this test can be used) for the duration of the COVID-19 declaration under Section 564(b)(1) of the Act, 21 U.S.C.section 360bbb-3(b)(1), unless the authorization is terminated  or revoked sooner.       Influenza A by PCR NEGATIVE NEGATIVE Final   Influenza B by PCR NEGATIVE NEGATIVE Final    Comment: (NOTE) The Xpert Xpress SARS-CoV-2/FLU/RSV plus assay is intended as an aid in the diagnosis of influenza from Nasopharyngeal swab specimens and should not be used as a sole basis for treatment. Nasal washings and aspirates are unacceptable for Xpert Xpress SARS-CoV-2/FLU/RSV testing.  Fact Sheet for Patients: BloggerCourse.com  Fact Sheet for Healthcare Providers: SeriousBroker.it  This test is not yet approved or cleared by the [place] FDA and has been authorized for detection and/or diagnosis of SARS-CoV-2 by FDA under an Emergency Use Authorization (EUA). This EUA will remain in effect (meaning this test can be used) for the duration of the COVID-19 declaration under Section 564(b)(1) of the Act, 21 U.S.C. section 360bbb-3(b)(1), unless the authorization is terminated or revoked.  Performed at [facility], [address] [place], [place] [address]     RN Pressure Injury Documentation:     Estimated body mass index is 25.81 kg/m as calculated from the following:   Height as of 10/26/20:  (1.778 m).   Weight as of this encounter: 81.6 kg.  Malnutrition Type:   Malnutrition Characteristics:   Nutrition Interventions:   Radiology Studies: MR ANGIO HEAD WO CONTRAST  Result Date: 11/08/2020 CLINICAL DATA:  History of vascular dementia, CVA, AFib presenting with slurred speech and left arm weakness EXAM: MRI HEAD WITHOUT CONTRAST MRA HEAD WITHOUT CONTRAST MRA NECK WITHOUT CONTRAST TECHNIQUE: Multiplanar, multiecho pulse sequences of the brain  and surrounding structures were obtained without intravenous contrast. Angiographic images of the Circle of Willis were obtained using MRA technique without intravenous contrast. Angiographic images of the neck were obtained using MRA technique without intravenous contrast. Carotid stenosis measurements (when applicable) are obtained utilizing NASCET criteria, using the distal internal carotid diameter as the denominator. COMPARISON:  Noncontrast CT head obtained 1 day prior, brain MRI 09/18/2020 FINDINGS: MRI HEAD FINDINGS Brain: There is patchy diffusion restriction in the right MCA distribution primarily in the corona radiata but also involving the peritrigonal white matter and internal capsule. There is no associated hemorrhage. There is patchy associated FLAIR signal abnormality. There is more ill-defined diffusion restriction in the left occipital lobe, though this is in an area of encephalomalacia and is favored to reflect T2 shine through from a prior infarct. There is a background of extensive FLAIR signal abnormality throughout the subcortical and periventricular white matter consistent with advanced chronic white matter microangiopathy. As above, there is a remote left occipital lobe infarct. Additional prior infarcts are seen in the left cerebellar hemisphere. The ventricles are stable in size. There is no solid mass lesion. There is no midline shift. Vascular: The major flow voids are present. The vasculature is assessed in full below. Skull and upper cervical spine: Normal marrow signal. Sinuses/Orbits: The paranasal sinuses are clear. The globes and orbits are unremarkable. Other: None. MRA HEAD FINDINGS Anterior circulation: The intracranial ICAs are patent. There is focal critical stenosis of the right  M1 segment with a possible trickle of flow related enhancement traversing the stenosis (see key image). This appears similar to the prior CTA head from 09/17/2020 allowing for difference in modality.  There is reconstitution of flow in the distal right MCA branches, though this appears diminished compared to the contralateral side. The left MCA is patent. There is focal high-grade stenosis of the left A2 segment, similar to the prior CTA (3-152). The ACAs are otherwise patent. Posterior circulation: The bilateral V4 segments are patent. The basilar artery is patent. There is diminished enhancement of the left PCA, likely unchanged compared to the prior CTA head and consistent with the history of prior left PCA infarct. The right PCA is patent and predominantly supplied from a prominent right posterior communicating artery. There is a fetal origin of the left PCA. There is no aneurysm. Anatomic variants: As above. MRA NECK FINDINGS Aortic arch: The imaged portions of the aortic arch are unremarkable Right carotid system: The right common, internal, and external carotid arteries demonstrate normal flow related enhancement without evidence of high-grade stenosis, occlusion, dissection, or aneurysm. Left carotid system: The left common, internal, and external carotid arteries demonstrate normal flow related enhancement without evidence of high-grade stenosis, occlusion, dissection, or aneurysm. Vertebral arteries: The vertebral arteries are patent with antegrade flow. There is no evidence of high-grade stenosis, occlusion, dissection, or aneurysm. IMPRESSION: 1. Acute to early subacute infarcts throughout the right MCA distribution primarily involving the corona radiata. 2. Remote infarcts in the left occipital lobe and left cerebellar hemisphere. 3. Critical stenosis of the right M1 segment with a trickle of flow lead enhancement traversing the stenosis, similar to the prior CTA head allowing for difference in modality. 4. Focal high-grade stenosis of the left A2 segment and multifocal stenosis with overall diminished enhancement in the left PCA are not significantly changed. 5. Patent vasculature of the neck with  no high-grade stenosis, occlusion, dissection, or aneurysm. Electronically Signed   By: Lesia Hausen M.D.   On: 11/08/2020 09:53   MR ANGIO NECK WO CONTRAST  Result Date: 11/08/2020 CLINICAL DATA:  History of vascular dementia, CVA, AFib presenting with slurred speech and left arm weakness EXAM: MRI HEAD WITHOUT CONTRAST MRA HEAD WITHOUT CONTRAST MRA NECK WITHOUT CONTRAST TECHNIQUE: Multiplanar, multiecho pulse sequences of the brain and surrounding structures were obtained without intravenous contrast. Angiographic images of the Circle of Willis were obtained using MRA technique without intravenous contrast. Angiographic images of the neck were obtained using MRA technique without intravenous contrast. Carotid stenosis measurements (when applicable) are obtained utilizing NASCET criteria, using the distal internal carotid diameter as the denominator. COMPARISON:  Noncontrast CT head obtained 1 day prior, brain MRI 09/18/2020 FINDINGS: MRI HEAD FINDINGS Brain: There is patchy diffusion restriction in the right MCA distribution primarily in the corona radiata but also involving the peritrigonal white matter and internal capsule. There is no associated hemorrhage. There is patchy associated FLAIR signal abnormality. There is more ill-defined diffusion restriction in the left occipital lobe, though this is in an area of encephalomalacia and is favored to reflect T2 shine through from a prior infarct. There is a background of extensive FLAIR signal abnormality throughout the subcortical and periventricular white matter consistent with advanced chronic white matter microangiopathy. As above, there is a remote left occipital lobe infarct. Additional prior infarcts are seen in the left cerebellar hemisphere. The ventricles are stable in size. There is no solid mass lesion. There is no midline shift. Vascular: The major flow voids are  present. The vasculature is assessed in full below. Skull and upper cervical spine:  Normal marrow signal. Sinuses/Orbits: The paranasal sinuses are clear. The globes and orbits are unremarkable. Other: None. MRA HEAD FINDINGS Anterior circulation: The intracranial ICAs are patent. There is focal critical stenosis of the right M1 segment with a possible trickle of flow related enhancement traversing the stenosis (see key image). This appears similar to the prior CTA head from 09/17/2020 allowing for difference in modality. There is reconstitution of flow in the distal right MCA branches, though this appears diminished compared to the contralateral side. The left MCA is patent. There is focal high-grade stenosis of the left A2 segment, similar to the prior CTA (3-152). The ACAs are otherwise patent. Posterior circulation: The bilateral V4 segments are patent. The basilar artery is patent. There is diminished enhancement of the left PCA, likely unchanged compared to the prior CTA head and consistent with the history of prior left PCA infarct. The right PCA is patent and predominantly supplied from a prominent right posterior communicating artery. There is a fetal origin of the left PCA. There is no aneurysm. Anatomic variants: As above. MRA NECK FINDINGS Aortic arch: The imaged portions of the aortic arch are unremarkable Right carotid system: The right common, internal, and external carotid arteries demonstrate normal flow related enhancement without evidence of high-grade stenosis, occlusion, dissection, or aneurysm. Left carotid system: The left common, internal, and external carotid arteries demonstrate normal flow related enhancement without evidence of high-grade stenosis, occlusion, dissection, or aneurysm. Vertebral arteries: The vertebral arteries are patent with antegrade flow. There is no evidence of high-grade stenosis, occlusion, dissection, or aneurysm. IMPRESSION: 1. Acute to early subacute infarcts throughout the right MCA distribution primarily involving the corona radiata. 2. Remote  infarcts in the left occipital lobe and left cerebellar hemisphere. 3. Critical stenosis of the right M1 segment with a trickle of flow lead enhancement traversing the stenosis, similar to the prior CTA head allowing for difference in modality. 4. Focal high-grade stenosis of the left A2 segment and multifocal stenosis with overall diminished enhancement in the left PCA are not significantly changed. 5. Patent vasculature of the neck with no high-grade stenosis, occlusion, dissection, or aneurysm. Electronically Signed   By: Lesia Hausen M.D.   On: 11/08/2020 09:53   MR Brain Wo Contrast (neuro protocol)  Result Date: 11/08/2020 CLINICAL DATA:  History of vascular dementia, CVA, AFib presenting with slurred speech and left arm weakness EXAM: MRI HEAD WITHOUT CONTRAST MRA HEAD WITHOUT CONTRAST MRA NECK WITHOUT CONTRAST TECHNIQUE: Multiplanar, multiecho pulse sequences of the brain and surrounding structures were obtained without intravenous contrast. Angiographic images of the Circle of Willis were obtained using MRA technique without intravenous contrast. Angiographic images of the neck were obtained using MRA technique without intravenous contrast. Carotid stenosis measurements (when applicable) are obtained utilizing NASCET criteria, using the distal internal carotid diameter as the denominator. COMPARISON:  Noncontrast CT head obtained 1 day prior, brain MRI 09/18/2020 FINDINGS: MRI HEAD FINDINGS Brain: There is patchy diffusion restriction in the right MCA distribution primarily in the corona radiata but also involving the peritrigonal white matter and internal capsule. There is no associated hemorrhage. There is patchy associated FLAIR signal abnormality. There is more ill-defined diffusion restriction in the left occipital lobe, though this is in an area of encephalomalacia and is favored to reflect T2 shine through from a prior infarct. There is a background of extensive FLAIR signal abnormality  throughout the subcortical and periventricular white matter  consistent with advanced chronic white matter microangiopathy. As above, there is a remote left occipital lobe infarct. Additional prior infarcts are seen in the left cerebellar hemisphere. The ventricles are stable in size. There is no solid mass lesion. There is no midline shift. Vascular: The major flow voids are present. The vasculature is assessed in full below. Skull and upper cervical spine: Normal marrow signal. Sinuses/Orbits: The paranasal sinuses are clear. The globes and orbits are unremarkable. Other: None. MRA HEAD FINDINGS Anterior circulation: The intracranial ICAs are patent. There is focal critical stenosis of the right M1 segment with a possible trickle of flow related enhancement traversing the stenosis (see key image). This appears similar to the prior CTA head from 09/17/2020 allowing for difference in modality. There is reconstitution of flow in the distal right MCA branches, though this appears diminished compared to the contralateral side. The left MCA is patent. There is focal high-grade stenosis of the left A2 segment, similar to the prior CTA (3-152). The ACAs are otherwise patent. Posterior circulation: The bilateral V4 segments are patent. The basilar artery is patent. There is diminished enhancement of the left PCA, likely unchanged compared to the prior CTA head and consistent with the history of prior left PCA infarct. The right PCA is patent and predominantly supplied from a prominent right posterior communicating artery. There is a fetal origin of the left PCA. There is no aneurysm. Anatomic variants: As above. MRA NECK FINDINGS Aortic arch: The imaged portions of the aortic arch are unremarkable Right carotid system: The right common, internal, and external carotid arteries demonstrate normal flow related enhancement without evidence of high-grade stenosis, occlusion, dissection, or aneurysm. Left carotid system: The left  common, internal, and external carotid arteries demonstrate normal flow related enhancement without evidence of high-grade stenosis, occlusion, dissection, or aneurysm. Vertebral arteries: The vertebral arteries are patent with antegrade flow. There is no evidence of high-grade stenosis, occlusion, dissection, or aneurysm. IMPRESSION: 1. Acute to early subacute infarcts throughout the right MCA distribution primarily involving the corona radiata. 2. Remote infarcts in the left occipital lobe and left cerebellar hemisphere. 3. Critical stenosis of the right M1 segment with a trickle of flow lead enhancement traversing the stenosis, similar to the prior CTA head allowing for difference in modality. 4. Focal high-grade stenosis of the left A2 segment and multifocal stenosis with overall diminished enhancement in the left PCA are not significantly changed. 5. Patent vasculature of the neck with no high-grade stenosis, occlusion, dissection, or aneurysm. Electronically Signed   By: Lesia Hausen M.D.   On: 11/08/2020 09:53   CT HEAD CODE STROKE WO CONTRAST  Result Date: 11/07/2020 CLINICAL DATA:  Code stroke. EXAM: CT HEAD WITHOUT CONTRAST TECHNIQUE: Contiguous axial images were obtained from the base of the skull through the vertex without intravenous contrast. COMPARISON:  10/26/2020. FINDINGS: Brain: New hypodensities in the right posterior limb of the internal capsule (series 3, image 17) and right corona radiata (series 3, image 20), of indeterminate acuity. Redemonstrated hypodensity in the left PCA territory, right ACA territory, left medulla, and left cerebellum. Periventricular white matter changes, likely the sequela of chronic small vessel ischemic disease. Vascular: No hyperdense vessel. Skull: Normal. Negative for fracture or focal lesion. Sinuses/Orbits: No acute finding. Other: The mastoids are well aerated. ASPECTS [facility] Stroke Program Early CT Score) - Ganglionic level infarction (caudate,  lentiform nuclei, internal capsule, insula, M1-M3 cortex): 6 - Supraganglionic infarction (M4-M6 cortex): 3 Total score (0-10 with 10 being normal): 9 IMPRESSION: 1.  New hypodensities in the right posterior limb of the internal capsule and right corona radiata, likely acute or subacute infarcts. No hemorrhage. 2. ASPECTS is 9. Code stroke imaging results were communicated on 11/07/2020 at 10:39 pm to provider [facility] via secure text paging. Electronically Signed   By: Wiliam Ke M.D.   On: 11/07/2020 22:42   ECHOCARDIOGRAM LIMITED  Result Date: 11/08/2020    ECHOCARDIOGRAM LIMITED REPORT   Patient Name:   Randall [name] Date of Exam: 11/08/2020 Medical Rec #:  [id number]   Height:       70.0 in Accession #:    [id number]  Weight:       179.9 lb Date of Birth:  February 27, 1945   BSA:          1.995 m Patient Age:    75 years    BP:           134/73 mmHg Patient Gender: M           HR:           56 bpm. Exam Location:  Inpatient Procedure: Limited Echo, Limited Color Doppler and Cardiac Doppler Indications:    stroke  History:        Patient has prior history of Echocardiogram examinations, most                 recent 09/17/2020. COPD, Signs/Symptoms:Alzheimer's; Risk                 Factors:Hypertension and Dyslipidemia.  Sonographer:    Delcie Roch RDCS Referring Phys: [id number] CHING T TU IMPRESSIONS  1. Left ventricular ejection fraction, by estimation, is 60 to 65%. The left ventricle has normal function. The left ventricle demonstrates regional wall motion abnormalities (see scoring diagram/findings for description). There is moderate left ventricular hypertrophy. Left ventricular diastolic function could not be evaluated. There is akinesis of the left ventricular, basal-mid inferolateral wall.  2. Right ventricular systolic function is mildly reduced. The right ventricular size is normal.  3. The mitral valve is normal in structure. Trivial mitral valve regurgitation. No evidence of mitral stenosis.  4. The  aortic valve is tricuspid. Aortic valve regurgitation is not visualized. No aortic stenosis is present. FINDINGS  Left Ventricle: Left ventricular ejection fraction, by estimation, is 60 to 65%. The left ventricle has normal function. The left ventricle demonstrates regional wall motion abnormalities. The left ventricular internal cavity size was normal in size. There is moderate left ventricular hypertrophy. Left ventricular diastolic function could not be evaluated. Right Ventricle: The right ventricular size is normal. Right ventricular systolic function is mildly reduced. Left Atrium: Left atrial size was normal in size. Right Atrium: Right atrial size was normal in size. Pericardium: There is no evidence of pericardial effusion. Mitral Valve: The mitral valve is normal in structure. Mild mitral annular calcification. Trivial mitral valve regurgitation. No evidence of mitral valve stenosis. Tricuspid Valve: The tricuspid valve is normal in structure. Tricuspid valve regurgitation is mild . No evidence of tricuspid stenosis. Aortic Valve: The aortic valve is tricuspid. Aortic valve regurgitation is not visualized. No aortic stenosis is present. Pulmonic Valve: The pulmonic valve was not assessed. Aorta: The aortic root is normal in size and structure. Venous: The inferior vena cava was not well visualized. IAS/Shunts: No atrial level shunt detected by color flow Doppler. LEFT VENTRICLE PLAX 2D LVIDd:         4.80 cm LVIDs:         3.70 cm LV PW:  1.30 cm LV IVS:        1.80 cm LVOT diam:     2.10 cm LVOT Area:     3.46 cm  LEFT ATRIUM         Index LA diam:    4.40 cm 2.21 cm/m  MITRAL VALVE                TRICUSPID VALVE MV Area (PHT): 2.54 cm     TR Peak grad:   24.2 mmHg MV Decel Time: 299 msec     TR Vmax:        246.00 cm/s MV E velocity: 67.90 cm/s MV A velocity: 128.00 cm/s  SHUNTS MV E/A ratio:  0.53         Systemic Diam: 2.10 cm Olga Millers MD Electronically signed by Olga Millers MD  Signature Date/Time: 11/08/2020/4:23:48 PM    Final     Scheduled Meds:   stroke: mapping our early stages of recovery book   Does not apply Once   apixaban  5 mg Oral BID   aspirin  300 mg Rectal Daily   atorvastatin  80 mg Oral QHS   haloperidol  0.5 mg Oral q morning   hydrALAZINE  100 mg Oral TID   insulin aspart  0-6 Units Subcutaneous TID WC   lipase/protease/amylase  12,000 Units Oral TID WC & HS   pantoprazole  40 mg Oral BID AC   sertraline  50 mg Oral Daily   sodium chloride flush  3 mL Intravenous Once   sodium zirconium cyclosilicate  5 g Oral Once   vitamin B-12  1,000 mcg Oral Daily   Continuous Infusions:  sodium chloride 75 mL/hr at 11/09/20 0904    LOS: 1 day   Merlene Laughter, DO [facility] PAGER is on AMION  If 7PM-7AM, please contact night-coverage [email or web address]

## 2020-11-09 NOTE — Progress Notes (Signed)
At shift assessment, patient very lethargic. Per nightshift RN patient was more alert than daytime yesterday. Unable to complete full assessment dt patient lethargy/LOC. SLP came by to assess to see if patient could be given diet orders and administer medications PO. Patient was unable to have full SLP assessment. Per SLP, will be back to attempt again later today.

## 2020-11-09 NOTE — Evaluation (Signed)
Speech Language Pathology Evaluation Patient Details Name: Randall [name] MRN: [id number] DOB: [birth year] Today's Date: 11/09/2020 Time: 3825-0539 SLP Time Calculation (min) (ACUTE ONLY): 11 min  Problem List:  Patient Active Problem List   Diagnosis Date Noted   Hyperkalemia 11/08/2020   Type 2 diabetes mellitus (HCC) 11/08/2020   Vascular dementia (HCC) 11/08/2020   CVA (cerebral vascular accident) (HCC) 11/07/2020   Stroke (cerebrum) (HCC) 09/19/2020   Acute CVA (cerebrovascular accident) (HCC) 09/17/2020   ARF (acute renal failure) (HCC) 09/17/2020   Normocytic anemia 09/17/2020   Past Medical History:  Past Medical History:  Diagnosis Date   Atrial fibrillation (HCC)    CVA (cerebral vascular accident) (HCC)    DM (diabetes mellitus) (HCC)    HTN (hypertension)    Past Surgical History: No past surgical history on file. HPI:  75 yo male presenting to ED on 10/20 with slurred speech and left arm weakness. 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 PMH including vascular dementia, history of CVA, atrial fibrillation, and DM.   Assessment / Plan / Recommendation Clinical Impression  Evaluation of speech, language and cognitive functions limited this date due to pt refusal to participate. Deficits in cognition were noted, although unsure of baseline function given hx of dementia. He is oriented to self only and demonstrates deficits in immediate/short term memory, problem solving and sustained/selective attention. Speech is clear, without dysarthria and he communicates in simple words/phrases, often verbalizing "no". He followed simple 1 step commands for completion of swallow eval and answers simple-moderately complex y/n questions with 100% accuracy. Confrontational naming also appears intact. SLP services warranted to f/u to continue dx treatment of cognitive-linguistic function and determine any changes from baseline.    SLP Assessment  SLP  Recommendation/Assessment: Patient needs continued Speech Lanaguage Pathology Services SLP Visit Diagnosis: Cognitive communication deficit (R41.841)    Recommendations for follow up therapy are one component of a multi-disciplinary discharge planning process, led by the attending physician.  Recommendations may be updated based on patient status, additional functional criteria and insurance authorization.    Follow Up Recommendations  Skilled Nursing facility    Frequency and Duration min 2x/week  2 weeks      SLP Evaluation Cognition  Overall Cognitive Status: No family/caregiver present to determine baseline cognitive functioning Arousal/Alertness: Awake/alert Orientation Level: Oriented to person;Disoriented to place;Disoriented to time;Disoriented to situation Year: Other (Comment) ("no") Month:  ("October" with choice cues) Day of Week: Incorrect Attention: Sustained;Selective Sustained Attention: Impaired Sustained Attention Impairment: Verbal basic Selective Attention: Impaired Selective Attention Impairment: Verbal basic Alternating Attention: Impaired Alternating Attention Impairment: Verbal basic Memory: Impaired Memory Impairment: Storage deficit;Retrieval deficit;Decreased recall of new information;Decreased short term memory Decreased Short Term Memory: Verbal basic Problem Solving: Impaired Problem Solving Impairment: Verbal basic Safety/Judgment: Impaired       Comprehension  Auditory Comprehension Overall Auditory Comprehension: Other (comment) (unable to fully assess due to refusal) Yes/No Questions: Within Functional Limits Visual Recognition/Discrimination Discrimination: Not tested Reading Comprehension Reading Status: Not tested    Expression Expression Primary Mode of Expression: Verbal Verbal Expression Overall Verbal Expression: Other (comment) (difficult to fully assess due to refusal) Initiation: No impairment Automatic Speech: Name;Social  Response Written Expression Written Expression: Not tested   Oral / Motor  Oral Motor/Sensory Function Overall Oral Motor/Sensory Function: Mild impairment Facial ROM: Reduced left;Suspected CN VII (facial) dysfunction Facial Symmetry: Abnormal symmetry left;Suspected CN VII (facial) dysfunction Lingual ROM: Within Functional Limits Lingual Symmetry: Abnormal symmetry left;Suspected CN XII (hypoglossal) dysfunction Motor  Speech Overall Motor Speech: Appears within functional limits for tasks assessed   GO                   Avie Echevaria, MA, CCC-SLP Acute Rehabilitation Services Office Number: [phone]  Paulette Blanch 11/09/2020, 4:15 PM

## 2020-11-09 NOTE — Progress Notes (Signed)
1300 assessment, patient still a little lethargic, but responding "No" to most questions/ commands. Unable to complete full assessment. MD notified.  1400 MD at bedside patient more alert/responsive. Will message SLP to see if full Eval can be performed.

## 2020-11-09 NOTE — Progress Notes (Signed)
SLP Cancellation Note  Patient Details Name: Randall [name] MRN: [id number] DOB: 11-Apr-1945   Cancelled treatment:       Reason Eval/Treat Not Completed: Fatigue/lethargy limiting ability to participate;Other (comment) (will attempt BSE later this date or next date if patient able to participate)   Angela Nevin, MA, CCC-SLP Speech Therapy

## 2020-11-10 DIAGNOSIS — Z66 Do not resuscitate: Secondary | ICD-10-CM

## 2020-11-10 DIAGNOSIS — F01511 Vascular dementia, unspecified severity, with agitation: Secondary | ICD-10-CM | POA: Diagnosis not present

## 2020-11-10 DIAGNOSIS — E1122 Type 2 diabetes mellitus with diabetic chronic kidney disease: Secondary | ICD-10-CM | POA: Diagnosis not present

## 2020-11-10 DIAGNOSIS — R627 Adult failure to thrive: Secondary | ICD-10-CM

## 2020-11-10 DIAGNOSIS — I63511 Cerebral infarction due to unspecified occlusion or stenosis of right middle cerebral artery: Secondary | ICD-10-CM

## 2020-11-10 DIAGNOSIS — E875 Hyperkalemia: Secondary | ICD-10-CM | POA: Diagnosis not present

## 2020-11-10 DIAGNOSIS — Z515 Encounter for palliative care: Secondary | ICD-10-CM

## 2020-11-10 LAB — CBC WITH DIFFERENTIAL/PLATELET
Abs Immature Granulocytes: 0.01 10*3/uL (ref 0.00–0.07)
Basophils Absolute: 0 10*3/uL (ref 0.0–0.1)
Basophils Relative: 1 %
Eosinophils Absolute: 0.1 10*3/uL (ref 0.0–0.5)
Eosinophils Relative: 2 %
HCT: 36.1 % — ABNORMAL LOW (ref 39.0–52.0)
Hemoglobin: 11.2 g/dL — ABNORMAL LOW (ref 13.0–17.0)
Immature Granulocytes: 0 %
Lymphocytes Relative: 19 %
Lymphs Abs: 1.1 10*3/uL (ref 0.7–4.0)
MCH: 29.4 pg (ref 26.0–34.0)
MCHC: 31 g/dL (ref 30.0–36.0)
MCV: 94.8 fL (ref 80.0–100.0)
Monocytes Absolute: 0.7 10*3/uL (ref 0.1–1.0)
Monocytes Relative: 12 %
Neutro Abs: 3.6 10*3/uL (ref 1.7–7.7)
Neutrophils Relative %: 66 %
Platelets: 202 10*3/uL (ref 150–400)
RBC: 3.81 MIL/uL — ABNORMAL LOW (ref 4.22–5.81)
RDW: 14.1 % (ref 11.5–15.5)
WBC: 5.5 10*3/uL (ref 4.0–10.5)
nRBC: 0 % (ref 0.0–0.2)

## 2020-11-10 LAB — COMPREHENSIVE METABOLIC PANEL
ALT: 13 U/L (ref 0–44)
AST: 28 U/L (ref 15–41)
Albumin: 2.8 g/dL — ABNORMAL LOW (ref 3.5–5.0)
Alkaline Phosphatase: 81 U/L (ref 38–126)
Anion gap: 8 (ref 5–15)
BUN: 25 mg/dL — ABNORMAL HIGH (ref 8–23)
CO2: 20 mmol/L — ABNORMAL LOW (ref 22–32)
Calcium: 8.4 mg/dL — ABNORMAL LOW (ref 8.9–10.3)
Chloride: 113 mmol/L — ABNORMAL HIGH (ref 98–111)
Creatinine, Ser: 1.38 mg/dL — ABNORMAL HIGH (ref 0.61–1.24)
GFR, Estimated: 53 mL/min — ABNORMAL LOW (ref >60)
Glucose, Bld: 118 mg/dL — ABNORMAL HIGH (ref 70–99)
Potassium: 4.7 mmol/L (ref 3.5–5.1)
Sodium: 141 mmol/L (ref 135–145)
Total Bilirubin: 0.9 mg/dL (ref 0.3–1.2)
Total Protein: 6.1 g/dL — ABNORMAL LOW (ref 6.5–8.1)

## 2020-11-10 LAB — PHOSPHORUS: Phosphorus: 2.6 mg/dL (ref 2.5–4.6)

## 2020-11-10 LAB — MAGNESIUM: Magnesium: 2.1 mg/dL (ref 1.7–2.4)

## 2020-11-10 LAB — GLUCOSE, CAPILLARY
Glucose-Capillary: 76 mg/dL (ref 70–99)
Glucose-Capillary: 112 mg/dL — ABNORMAL HIGH (ref 70–99)
Glucose-Capillary: 121 mg/dL — ABNORMAL HIGH (ref 70–99)

## 2020-11-10 LAB — RPR: RPR Ser Ql: NONREACTIVE

## 2020-11-10 MED ORDER — ASPIRIN EC 81 MG PO TBEC
81.0000 mg | DELAYED_RELEASE_TABLET | Freq: Every day | ORAL | Status: DC
  Administered 2020-11-10: 81 mg via ORAL
  Filled 2020-11-10: qty 1

## 2020-11-10 MED ORDER — MORPHINE SULFATE (CONCENTRATE) 10 MG/0.5ML PO SOLN
5.0000 mg | ORAL | Status: DC | PRN
Start: 2020-11-10 — End: 2020-11-14

## 2020-11-10 NOTE — Progress Notes (Signed)
PROGRESS NOTE    Asuncion Shibata  DVV:[id number] DOB: 07/17/1945 DOA: 11/07/2020 PCP: Edmonia Caprio, NP  Brief Narrative:  The patient is a 75 year old overweight chronically ill-appearing Caucasian male with a past medical history significant for but not limited to vascular dementia, history of CVA, atrial fibrillation on anticoagulation with Eliquis as well as other comorbidities including diabetes mellitus type 2 and hypertension who presented with slurred speech and left arm weakness from his SNF.  He is only alert and oriented to self and only his answers no to every question on admission.  History is obtained from the ED documentation as well as ED physician initially and he was reportedly sent here for slurred speech and left arm weakness that happened around 630 the night before.  Last shock was in August with a left cerebral infarct.  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  He was evaluated several times this month for recurrent falls with negative work-up including CT head.  He presented as a code stroke and had findings of a new hypodensity in the right posterior limb of the internal capsule and right corona radiata on CT head.  He underwent further work-up and neurology was evaluated and recommended further stroke work-up.  He had an EEG done which was technically a difficult study but was suggestive of cortical dysfunction arising from the right temporal region likely secondary to his underlying structural abnormality and stroke but there is no evidence of seizure or epileptiform discharges seen throughout the recording.  Palliative care met with the patient and patient's family and because he is refusing to eat and not very interactive family decided to accept goals of care and transition to a comfort approach.  All medications on the patient's comfort were discontinued and we  will reevaluate and watch him closely and he may be a candidate for residential hospice.  Assessment & Plan:   Principal Problem:   CVA (cerebral vascular accident) [facility]) Active Problems:   Hyperkalemia   Type 2 diabetes mellitus ([place])   Vascular dementia ([place])  Acute CVA with right MCA territory infarcts infarct embolic secondary large vessel disease in the setting of hypotension -Neurology consulted and he had code stroke CT scan which showed new hypodensity in the right posterior limb of internal capsule and right corona radiata likely acute or subacute infarcts there is no hemorrhage -MRI of the brain showed a acute early subacute infarcts of the right MCA distribution primary involving the right corona radiata and remote infarcts in the left occipital lobe and some cerebellar hemisphere -MRI of the head and neck done and showed critical stenosis of right M1 segment with a trickle of flow of lead and has been transversing the stenosis similar to prior to CT a head allowing for differences in modality as well as focal high-grade stenosis of the left A2 segment and multifocal stenosis with overall diminished enhancement in the left PCA that was not significantly changed.  Patient also had a vasculature of the neck with no high-grade stenosis occlusion or dissection or aneurysm -EEG done and showed "This technically difficult study is suggestive of cortical dysfunction arising from right temporal region. likely secondary to underlying structural abnormality/stroke. No seizures or epileptiform discharges were seen throughout the recording." -Echocardiogram done a few months ago showed an EF of 55 to 60%, moderate dilated left atrium and no IA shunt -  Lipid panel done and hemoglobin A1c obtained; lipid panel showed a total cholesterol/HDL panel of 5.6, cholesterol level 150, HDL of 27, LDL of 86, triglycerides of 185, and VLDL of 37 -Hemoglobin A1c was 7.6 -Patient was n.p.o. given his diet but now  after SLP evaluation he has been placed on a dysphagia 1 diet with nectar thick liquids -PT OT and SLP consulted -Continue with frequent neurochecks and keep on telemetry -Per neurology the goal blood pressure is between 160-109 systolic given severe intracranial stenosis -We will resume his Eliquis and statin now that SLP recommends dysphagia 1 diet with nectar thick liquids -PT OT recommending SNF but he may be residential hospice candidate -Palliative care has been consulted for goals of care discussion given his vascular dementia and recurrent strokes; after further goals of care discussion with palliative the family decided to transition the patient to comfort measures and all medications not in the patient's comfort were discontinued  Hypertension -Neurology recommending goal blood pressure to be between 130-160 given severe intracranial stenosis -He is on carvedilol at home -Continue to monitor blood pressure per protocol however now that he is being transitioned to comfort care we will discontinue his antihypertensives -Last blood pressure reading was 166/48  Hyperlipidemia -Lipid panel as above -Continue with atorvastatin 40 mg p.o. daily  Diabetes Mellitus Type 2 -Takes glipizide at the SNF -Recent hemoglobin A1c was 7.6 this admission and prior 1 was 8 and 08/2020 -Continue with sensitive Lovenox SQ insulin AC -Continue monitor CBGs per protocol; CBGs have been ranging from 76-121 but will not check again now that he is being transitioned to comfort  AKI on CKD stage 2 Metabolic acidosis -BUNs/creatinine is now 34/2.23 yesterday and improved further to 25/1.38 -Continued with IV fluid hydration with normal saline at 75 MLS per hour but now will be discontinued and the patient has been transitioned to comfort care -This a.m. he had a CO2 of 20, anion gap of 8, chloride level 113 -Avoid nephrotoxic medications, contrast dyes, hypotension renally dose medications -Will not repeat  CMP noted to be in transition to comfort  Normocytic anemia/anemia chronic disease -His hemoglobin/hematocrit went from 14.3/42.0 and then trended down to 12.5/41.1 yesterday and today was 11.2/36.1 -Check anemia panel in a.m. -Continue to monitor for signs and symptoms bleeding; currently no overt bleeding noted -Will not repeat CBC now that he is being transitioned to comfort care   Hyperkalemia -mild K of 5.3-->4.8 and today it was 4.7 again -given Lokelma x 1 and is improved -Will not continue monitor and trend and will not repeat CMP in the a.m.  Hypoalbuminemia -Patient is albumin level 1 from 3.1 and trended down to 2.8 -Currently getting IV fluid hydration as above but now.  In the setting of transitioning to comfort  Vascular Dementia with behavioral disturbances and lethargy and refusal of care -Only oriented to self. Reportedly had behavior issues in the past and has been lethargic today and not really interactive and refusing care -Palliative care has been consulted for further goals of care discussion and after further goals of care discussion he is being transitioned to full comfort measures and may be a candidate for residential hospice pending his clinical course -Has mitten restraints -Placed on delirium precautions  Goals of Care: -Palliative involved and he is a DNR and now they have been transitioned to full comfort measures and has a high risk for clinical decompensation   DVT prophylaxis: Apixaban Code Status: DO NOT RESUSCITATE  Family Communication: No  family present at bedside by palliative had an extensive goals of care discussion Disposition Plan: Possible residential hospice pending his clinical course Status is: Inpatient  Remains inpatient appropriate because: New CVA and will need palliative care discussion for further goals of care; now has been transitioned to comfort care and will need to be evaluated closely for further decompensation and possible  placement and residential hospice  Consultants:  Neurology Palliative care medicine  Procedures:  ECHOCARDIOGRAM IMPRESSIONS     1. Left ventricular ejection fraction, by estimation, is 60 to 65%. The  left ventricle has normal function. The left ventricle demonstrates  regional wall motion abnormalities (see scoring diagram/findings for  description). There is moderate left  ventricular hypertrophy. Left ventricular diastolic function could not be  evaluated. There is akinesis of the left ventricular, basal-mid  inferolateral wall.   2. Right ventricular systolic function is mildly reduced. The right  ventricular size is normal.   3. The mitral valve is normal in structure. Trivial mitral valve  regurgitation. No evidence of mitral stenosis.   4. The aortic valve is tricuspid. Aortic valve regurgitation is not  visualized. No aortic stenosis is present.   FINDINGS   Left Ventricle: Left ventricular ejection fraction, by estimation, is 60  to 65%. The left ventricle has normal function. The left ventricle  demonstrates regional wall motion abnormalities. The left ventricular  internal cavity size was normal in size.  There is moderate left ventricular hypertrophy. Left ventricular diastolic  function could not be evaluated.   Right Ventricle: The right ventricular size is normal. Right ventricular  systolic function is mildly reduced.   Left Atrium: Left atrial size was normal in size.   Right Atrium: Right atrial size was normal in size.   Pericardium: There is no evidence of pericardial effusion.   Mitral Valve: The mitral valve is normal in structure. Mild mitral annular  calcification. Trivial mitral valve regurgitation. No evidence of mitral  valve stenosis.   Tricuspid Valve: The tricuspid valve is normal in structure. Tricuspid  valve regurgitation is mild . No evidence of tricuspid stenosis.   Aortic Valve: The aortic valve is tricuspid. Aortic valve  regurgitation is  not visualized. No aortic stenosis is present.   Pulmonic Valve: The pulmonic valve was not assessed.   Aorta: The aortic root is normal in size and structure.   Venous: The inferior vena cava was not well visualized.   IAS/Shunts: No atrial level shunt detected by color flow Doppler.   LEFT VENTRICLE  PLAX 2D  LVIDd:         4.80 cm  LVIDs:         3.70 cm  LV PW:         1.30 cm  LV IVS:        1.80 cm  LVOT diam:     2.10 cm  LVOT Area:     3.46 cm      LEFT ATRIUM         Index  LA diam:    4.40 cm 2.21 cm/m   MITRAL VALVE                TRICUSPID VALVE  MV Area (PHT): 2.54 cm     TR Peak grad:   24.2 mmHg  MV Decel Time: 299 msec     TR Vmax:        246.00 cm/s  MV E velocity: 67.90 cm/s  MV A velocity: 128.00 cm/s  SHUNTS  MV E/A ratio:  0.53         Systemic Diam: 2.10 cm   Antimicrobials:  Anti-infectives (From admission, onward)    None        Subjective: Seen and examined at bedside and he was very confused and wanted to be left alone.  He had been refusing his meals this morning and was not as interactive as he was yesterday.  He indicated that he wanted to be left alone.  No other concerns or points at this time and no family was at bedside when I evaluated the patient this morning.  Objective: Vitals:   11/10/20 0022 11/10/20 0408 11/10/20 0800 11/10/20 1108  BP: (!) 177/77 (!) 159/72 (!) 156/65 (!) 166/48  Pulse: 75 62 62 62  Resp: '18 18 18 14  ' Temp: 97.9 F (36.6 C) 98 F (36.7 C) 97.9 F (36.6 C) 97.9 F (36.6 C)  TempSrc: Oral Oral Oral Oral  SpO2: 99% 98%  97%  Weight:        Intake/Output Summary (Last 24 hours) at 11/10/2020 1706 Last data filed at 11/09/2020 2230 Gross per 24 hour  Intake 120 ml  Output 550 ml  Net -430 ml    Filed Weights   11/07/20 2228  Weight: 81.6 kg   Examination: Physical Exam:  Constitutional: WN/WD overweight demented Caucasian male in no acute distress appears agitated today  and wants to be left alone Eyes: Lids and conjunctivae normal, sclerae anicteric  ENMT: External Ears, Nose appear normal. Grossly normal hearing. Mucous membranes are moist.  Neck: Appears normal, supple, no cervical masses, normal ROM, no appreciable thyromegaly; no appreciable JVD Respiratory: Diminished to auscultation bilaterally, no wheezing, rales, rhonchi or crackles. Normal respiratory effort and patient is not tachypenic. No accessory muscle use.  Unlabored breathing Cardiovascular: RRR, no murmurs / rubs / gallops. S1 and S2 auscultated.  I has mild s 1+ lower extremity edema Abdomen: Soft, non-tender, distended secondary body habitus.  Bowel sounds positive.  GU: Deferred. Musculoskeletal: No clubbing / cyanosis of digits/nails. No joint deformity upper and lower extremities.  Skin: No rashes, lesions, ulcers on limited skin evaluation. No induration; Warm and dry.  Neurologic: CN 2-12 grossly intact with no focal deficits. Romberg sign and cerebellar reflexes not assessed.  Psychiatric: Impaired judgment and insight.  He is awake and only oriented x1. Has a slightly agitated mood  Data Reviewed: I have personally reviewed following labs and imaging studies  CBC: Recent Labs  Lab 11/07/20 2221 11/07/20 2226 11/09/20 0930 11/10/20 0903  WBC 7.7  --  6.4 5.5  NEUTROABS 5.8  --  4.2 3.6  HGB 13.2 14.3 12.5* 11.2*  HCT 43.6 42.0 41.1 36.1*  MCV 97.3  --  95.1 94.8  PLT 253  --  224 809    Basic Metabolic Panel: Recent Labs  Lab 11/07/20 2221 11/07/20 2226 11/08/20 1010 11/09/20 0201 11/09/20 0930 11/10/20 0903  NA 139 141 140 142  --  141  K 5.5* 5.3* 4.8 4.7  --  4.7  CL 104 106 108 109  --  113*  CO2 23  --  22 24  --  20*  GLUCOSE 150* 150* 102* 86  --  118*  BUN 39* 40* 38* 34*  --  25*  CREATININE 3.33* 3.60* 3.03* 2.23*  --  1.38*  CALCIUM 9.0  --  8.4* 8.7*  --  8.4*  MG  --   --   --   --  2.4  2.1  PHOS  --   --   --   --  3.3 2.6    GFR: Estimated  Creatinine Clearance: 46.3 mL/min (A) (by C-G formula based on SCr of 1.38 mg/dL (H)). Liver Function Tests: Recent Labs  Lab 11/07/20 2221 11/08/20 1010 11/09/20 0201 11/10/20 0903  AST '20 20 21 28  ' ALT '12 10 13 13  ' ALKPHOS 102 87 93 81  BILITOT 0.5 0.6 0.7 0.9  PROT 7.1 6.1* 6.5 6.1*  ALBUMIN 3.4* 2.9* 3.1* 2.8*    No results for input(s): LIPASE, AMYLASE in the last 168 hours. Recent Labs  Lab 11/09/20 0930  AMMONIA 19    Coagulation Profile: Recent Labs  Lab 11/07/20 2221  INR 1.7*    Cardiac Enzymes: No results for input(s): CKTOTAL, CKMB, CKMBINDEX, TROPONINI in the last 168 hours. BNP (last 3 results) No results for input(s): PROBNP in the last 8760 hours. HbA1C: Recent Labs    11/07/20 2221  HGBA1C 7.6*    CBG: Recent Labs  Lab 11/09/20 1551 11/09/20 2107 11/10/20 0549 11/10/20 1109 11/10/20 1634  GLUCAP 97 108* 76 112* 121*    Lipid Profile: Recent Labs    11/07/20 2221  CHOL 150  HDL 27*  LDLCALC 86  TRIG 185*  CHOLHDL 5.6    Thyroid Function Tests: Recent Labs    11/09/20 0930  TSH 0.715    Anemia Panel: Recent Labs    11/09/20 0930  VITAMINB12 1,192*    Sepsis Labs: No results for input(s): PROCALCITON, LATICACIDVEN in the last 168 hours.  Recent Results (from the past 240 hour(s))  Resp Panel by RT-PCR (Flu A&B, Covid) Nasopharyngeal Swab     Status: None   Collection Time: 11/08/20  2:06 AM   Specimen: Nasopharyngeal Swab; Nasopharyngeal(NP) swabs in vial transport medium  Result Value Ref Range Status   SARS Coronavirus 2 by RT PCR NEGATIVE NEGATIVE Final    Comment: (NOTE) SARS-CoV-2 target nucleic acids are NOT DETECTED.  The SARS-CoV-2 RNA is generally detectable in upper respiratory specimens during the acute phase of infection. The lowest concentration of SARS-CoV-2 viral copies this assay can detect is 138 copies/mL. A negative result does not preclude SARS-Cov-2 infection and should not be used as the  sole basis for treatment or other patient management decisions. A negative result may occur with  improper specimen collection/handling, submission of specimen other than nasopharyngeal swab, presence of viral mutation(s) within the areas targeted by this assay, and inadequate number of viral copies(<138 copies/mL). A negative result must be combined with clinical observations, patient history, and epidemiological information. The expected result is Negative.  Fact Sheet for Patients:  EntrepreneurPulse.com.au  Fact Sheet for Healthcare Providers:  IncredibleEmployment.be  This test is no t yet approved or cleared by the [place] FDA and  has been authorized for detection and/or diagnosis of SARS-CoV-2 by FDA under an Emergency Use Authorization (EUA). This EUA will remain  in effect (meaning this test can be used) for the duration of the COVID-19 declaration under Section 564(b)(1) of the Act, 21 U.S.C.section 360bbb-3(b)(1), unless the authorization is terminated  or revoked sooner.       Influenza A by PCR NEGATIVE NEGATIVE Final   Influenza B by PCR NEGATIVE NEGATIVE Final    Comment: (NOTE) The Xpert Xpress SARS-CoV-2/FLU/RSV plus assay is intended as an aid in the diagnosis of influenza from Nasopharyngeal swab specimens and should not be used as a sole basis for treatment. Nasal washings and aspirates are  unacceptable for Xpert Xpress SARS-CoV-2/FLU/RSV testing.  Fact Sheet for Patients: EntrepreneurPulse.com.au  Fact Sheet for Healthcare Providers: IncredibleEmployment.be  This test is not yet approved or cleared by the [place] FDA and has been authorized for detection and/or diagnosis of SARS-CoV-2 by FDA under an Emergency Use Authorization (EUA). This EUA will remain in effect (meaning this test can be used) for the duration of the COVID-19 declaration under Section 564(b)(1) of the  Act, 21 U.S.C. section 360bbb-3(b)(1), unless the authorization is terminated or revoked.  Performed at [facility], [place] [address] [place], [place] [address]      RN Pressure Injury Documentation:     Estimated body mass index is 25.81 kg/m as calculated from the following:   Height as of 10/26/20: '5\' 10"'  (1.778 m).   Weight as of this encounter: 81.6 kg.  Malnutrition Type:   Malnutrition Characteristics:   Nutrition Interventions:   Radiology Studies: EEG adult  Result Date: 11/24/2020 Lora Havens, MD     24-Nov-2020  9:26 PM Patient Name: Jayquon Theiler MRN: [id number] Epilepsy Attending: Lora Havens Referring Physician/Provider: Dr Carlisle Cater Date: 11-24-2020 Duration: 21.24 mins Patient history: 75yo M with dementia, acute Right MCA infarct with ams. EEG to evaluate for seizure. Level of alertness: Awake AEDs during EEG study: None Technical aspects: This EEG study was done with scalp electrodes positioned according to the 10-20 International system of electrode placement. Electrical activity was acquired at a sampling rate of '500Hz'  and reviewed with a high frequency filter of '70Hz'  and a low frequency filter of '1Hz' . EEG data were recorded continuously and digitally stored. Description: The posterior dominant rhythm consists of 8 Hz activity of moderate voltage (25-35 uV) seen predominantly in posterior head regions, symmetric and reactive to eye opening and eye closing. EEG showed continuous 3 to 6 Hz theta-delta slowing in right temporal region.  Hyperventilation and photic stimulation were not performed.   Of note, this study was technically difficult due to significant movement artifact. ABNORMALITY - Continuous slow, right temporal region. IMPRESSION: This technically difficult study is suggestive of cortical dysfunction arising from right temporal region. likely secondary to underlying structural abnormality/stroke. No seizures or epileptiform discharges were  seen throughout the recording. Priyanka Barbra Sarks    Scheduled Meds:   stroke: mapping our early stages of recovery book   Does not apply Once   apixaban  5 mg Oral BID   aspirin EC  81 mg Oral Daily   atorvastatin  80 mg Oral QHS   haloperidol  0.5 mg Oral q morning   hydrALAZINE  100 mg Oral TID   insulin aspart  0-6 Units Subcutaneous TID WC   lipase/protease/amylase  12,000 Units Oral TID WC & HS   pantoprazole  40 mg Oral BID AC   sertraline  50 mg Oral Daily   sodium chloride flush  3 mL Intravenous Once   vitamin B-12  1,000 mcg Oral Daily   Continuous Infusions:  sodium chloride 75 mL/hr at 11-24-20 2245    LOS: 2 days   Kerney Elbe, DO [facility] PAGER is on [place]  If 7PM-7AM, please contact night-coverage [email or web address]

## 2020-11-10 NOTE — Progress Notes (Signed)
SLP Cancellation Note  Patient Details Name: Randall [name] MRN: [id number] DOB: 1945/10/27   Cancelled treatment:       Reason Eval/Treat Not Completed: Other (comment) (RN informed SLP that patient to be going full comfort measures. SLP to cancel planned MBS next date and will s/o on patient at this time.)   Angela Nevin, MA, CCC-SLP Speech Therapy

## 2020-11-10 NOTE — Plan of Care (Signed)
Patient orders for comfort care   Problem: Education: Goal: Knowledge of General Education information will improve Description: Including pain rating scale, medication(s)/side effects and non-pharmacologic comfort measures Outcome: Completed/Met   Problem: Health Behavior/Discharge Planning: Goal: Ability to manage health-related needs will improve Outcome: Completed/Met   Problem: Clinical Measurements: Goal: Ability to maintain clinical measurements within normal limits will improve Outcome: Completed/Met Goal: Will remain free from infection Outcome: Completed/Met Goal: Diagnostic test results will improve Outcome: Completed/Met Goal: Respiratory complications will improve Outcome: Completed/Met Goal: Cardiovascular complication will be avoided Outcome: Completed/Met   Problem: Activity: Goal: Risk for activity intolerance will decrease Outcome: Completed/Met   Problem: Nutrition: Goal: Adequate nutrition will be maintained Outcome: Completed/Met   Problem: Coping: Goal: Level of anxiety will decrease Outcome: Completed/Met   Problem: Elimination: Goal: Will not experience complications related to bowel motility Outcome: Completed/Met Goal: Will not experience complications related to urinary retention Outcome: Completed/Met   Problem: Pain Managment: Goal: General experience of comfort will improve Outcome: Completed/Met   Problem: Safety: Goal: Ability to remain free from injury will improve Outcome: Completed/Met   Problem: Skin Integrity: Goal: Risk for impaired skin integrity will decrease Outcome: Completed/Met   Problem: Education: Goal: Knowledge of secondary prevention will improve (SELECT ALL) Outcome: Completed/Met   Problem: Self-Care: Goal: Ability to communicate needs accurately will improve Outcome: Completed/Met   Problem: Nutrition: Goal: Dietary intake will improve Outcome: Completed/Met   Problem: Ischemic Stroke/TIA Tissue  Perfusion: Goal: Complications of ischemic stroke/TIA will be minimized Outcome: Completed/Met

## 2020-11-10 NOTE — Consult Note (Signed)
Consultation Note Date: 11/10/2020   Patient Name: Randall [name]  DOB: 1945-06-18  MRN: [id number]  Age / Sex: 75 y.o., male  PCP: Edmonia Caprio, NP Referring Physician: Kerney Elbe, DO  Reason for Consultation: Establishing goals of care and Psychosocial/spiritual support  HPI/Patient Profile: 75 y.o. male  admitted on 11/07/2020 with past medical history significant for vascular dementia, history of CVA, atrial fibrillation on Eliquis who presents from skilled nursing facility with concerns of slurred speech and left arm weakness.   Patient alert and oriented only to self and answers no to every question.  History obtained from ED documentation ED physician.  Reportedly he was sent here by his facility for slurred vision and left arm weakness at 1830.  Last stroke in August with left cerebral infarct.  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   More recently, he has been evaluated several times this month for recurrent fall with negative work-up including CT head.  Family report dramatic physical, functional and cognitive decline over the past 2 months.  Patient's wife died back in 2020/08/11.       ED Course: Presented as a code stroke and had findings of new hypodensity in the right posterior limb of the internal capsule and right corona radiata on CT head. CBC was unremarkable without leukocytosis or anemia.  Sodium 141, potassium of 5.3, creatinine significantly elevated to 3.6 from prior of 1.43, BUN of 40, BG of 150.   Family face treatment option decisions, advanced directive decisions and anticipatory care needs.   Clinical Assessment and Goals of Care:  This NP Wadie Lessen reviewed medical records, received report from team, assessed the patient and then meet at the patient's  bedside [place] with his son Randall [name] and his wife Randall [name] to discuss diagnosis, prognosis, GOC, EOL wishes disposition and options.   Concept of Palliative Care was introduced as specialized medical care for people and their families living with serious illness.  If focuses on providing relief from the symptoms and stress of a serious illness.  The goal is to improve quality of life for both the patient and the family.  Values and goals of care important to patient and family were attempted to be elicited.  Created space and opportunity for patient  and family to explore thoughts and feelings regarding current medical situation.  Family presents patient's advance care planning documents to include desire for a natural death.  Patient's son clearly verbalizes an understanding that his father would not want all this and that he would want to stop life prolonging measures.     A  discussion was had today regarding advanced directives.  Concepts specific to code status, artifical feeding and hydration, continued IV antibiotics and rehospitalization was had.  The difference between a aggressive medical intervention path  and a palliative comfort care path for this patient at this time, in  this situation was had.     Education offered on hospice benefit specifically as it relates to residential hospice  MOST form completed to reflect full comfort   Natural trajectory and expectations at EOL were discussed.  Questions and concerns addressed.  Patient  encouraged to call with questions or concerns.     PMT will continue to support holistically.              HCPOA/Brandon Pearline Cables    SUMMARY OF RECOMMENDATIONS    Code Status/Advance Care Planning: DNR   Symptom Management:  Pain/dyspnea: Roxanol 5 mg p.o./sublingual every 2 hours as needed   Palliative Prophylaxis:  Bowel Regimen, Delirium Protocol, Frequent Pain Assessment, and Oral Care  Additional Recommendations  (Limitations, Scope, Preferences): Full Comfort Care  Psycho-social/Spiritual:  Desire for further Chaplaincy support:no Additional Recommendations: Education on Hospice  Prognosis:  < 2 weeks  Discharge Planning:  PMT will reevaluate in the morning   To Be Determined      Primary Diagnoses: Present on Admission:  CVA (cerebral vascular accident) ([place])   I have reviewed the medical record, interviewed the patient and family, and examined the patient. The following aspects are pertinent.  Past Medical History:  Diagnosis Date   Atrial fibrillation ([place])    CVA (cerebral vascular accident) ([place])    DM (diabetes mellitus) ([place])    HTN (hypertension)    Social History   Socioeconomic History   Marital status: Widowed    Spouse name: Not on file   Number of children: Not on file   Years of education: Not on file   Highest education level: Not on file  Occupational History   Not on file  Tobacco Use   Smoking status: Not on file   Smokeless tobacco: Never  Substance and Sexual Activity   Alcohol use: Never   Drug use: Not on file   Sexual activity: Not on file  Other Topics Concern   Not on file  Social History Narrative   ** Merged History Encounter **       Social Determinants of Health   Financial Resource Strain: Not on file  Food Insecurity: Not on file  Transportation Needs: Not on file  Physical Activity: Not on file  Stress: Not on file  Social Connections: Not on file   Family History  Family history unknown: Yes   Scheduled Meds:   stroke: mapping our early stages of recovery book   Does not apply Once   apixaban  5 mg Oral BID   aspirin EC  81 mg Oral Daily   atorvastatin  80 mg Oral QHS   haloperidol  0.5 mg Oral q morning   hydrALAZINE  100 mg Oral TID   insulin aspart  0-6 Units Subcutaneous TID WC   lipase/protease/amylase  12,000 Units Oral TID WC & HS   pantoprazole  40 mg Oral BID AC   sertraline  50 mg Oral Daily   sodium  chloride flush  3 mL Intravenous Once   vitamin B-12  1,000 mcg Oral Daily   Continuous Infusions:  sodium chloride 75 mL/hr at 11/09/20 2245   PRN Meds:. Medications Prior to Admission:  Prior to Admission medications   Medication Sig Start Date End Date Taking? Authorizing Provider  acetaminophen (TYLENOL) 325 MG tablet Take 650 mg by mouth every 6 (six) hours as needed (pain).   Yes [provider]  apixaban (ELIQUIS) 5 MG TABS tablet Take 1 tablet (5 mg total) by mouth  in the morning and at bedtime. 09/19/20  Yes Gherghe, Vella Redhead, MD  aspirin-sod bicarb-citric acid (ALKA-SELTZER ORIGINAL) 325 MG TBEF tablet Take 650 mg by mouth daily as needed (heartburn).   Yes [provider]  atorvastatin (LIPITOR) 40 MG tablet Take 80 mg by mouth at bedtime.   Yes [provider]  carvedilol (COREG) 6.25 MG tablet Take 1 tablet (6.25 mg total) by mouth 2 (two) times daily with a meal. 09/19/20  Yes Gherghe, Vella Redhead, MD  dapagliflozin propanediol (FARXIGA) 10 MG TABS tablet Take 10 mg by mouth every morning.   Yes [provider]  dicyclomine (BENTYL) 10 MG capsule Take 1 capsule (10 mg total) by mouth 4 (four) times daily -  before meals and at bedtime. 09/19/20  Yes Caren Griffins, MD  divalproex (DEPAKOTE) 125 MG DR tablet Take 125 mg by mouth daily. 10/29/20  Yes [provider]  docusate sodium (COLACE) 100 MG capsule Take 1 capsule (100 mg total) by mouth in the morning and at bedtime. 09/19/20  Yes Gherghe, Vella Redhead, MD  feeding supplement (ENSURE IMMUNE HEALTH) LIQD Take 237 mLs by mouth daily.   Yes [provider]  glipiZIDE (GLUCOTROL) 5 MG tablet Take 1 tablet (5 mg total) by mouth 2 (two) times daily before a meal. 09/19/20  Yes Gherghe, Vella Redhead, MD  hydrALAZINE (APRESOLINE) 50 MG tablet Take 2 tablets (100 mg total) by mouth 3 (three) times daily. 09/19/20  Yes Caren Griffins, MD  lipase/protease/amylase (CREON) 12000-38000 units CPEP  capsule Take 1 capsule (12,000 Units total) by mouth 4 (four) times daily. 09/19/20  Yes Caren Griffins, MD  meclizine (ANTIVERT) 25 MG tablet Take 1 tablet (25 mg total) by mouth every 8 (eight) hours as needed for dizziness. 09/19/20  Yes Caren Griffins, MD  mirtazapine (REMERON) 15 MG tablet Take 15 mg by mouth at bedtime. 10/29/20  Yes [provider]  ondansetron (ZOFRAN) 4 MG tablet Take 1 tablet (4 mg total) by mouth every 8 (eight) hours as needed for nausea or vomiting. Patient taking differently: Take 4 mg by mouth See admin instructions. Take 93m oral three times daily May also take 465mextra every 8 hours as needed for nausea/vomiting- max 3 tablets/24 hours 09/19/20  Yes Gherghe, CoVella RedheadMD  pantoprazole (PROTONIX) 40 MG tablet Take 1 tablet (40 mg total) by mouth 2 (two) times daily before a meal. 09/19/20  Yes Gherghe, CoVella RedheadMD  polyethylene glycol powder (GLYCOLAX/MIRALAX) 17 GM/SCOOP powder Take 17 g by mouth daily as needed for moderate constipation. 09/19/20  Yes GhCaren GriffinsMD  sertraline (ZOLOFT) 50 MG tablet Take 50 mg by mouth daily. 10/29/20  Yes [provider]  sulfamethoxazole-trimethoprim (BACTRIM DS) 800-160 MG tablet Take 1 tablet by mouth 2 (two) times daily. 10/29/20  Yes [provider]  valsartan (DIOVAN) 320 MG tablet Take 1 tablet (320 mg total) by mouth daily. 09/19/20  Yes Gherghe, CoVella RedheadMD  vitamin B-12 (CYANOCOBALAMIN) 1000 MCG tablet Take 1 tablet (1,000 mcg total) by mouth daily. 09/19/20  Yes Gherghe, CoVella RedheadMD  zolpidem (AMBIEN) 5 MG tablet Take 5 mg by mouth at bedtime.   Yes [provider]  atorvastatin (LIPITOR) 40 MG tablet Take 2 tablets (80 mg total) by mouth at bedtime. Patient not taking: Reported on 11/08/2020 09/19/20   GhCaren GriffinsMD  blood glucose meter kit and supplies Dispense based on patient and insurance preference. Use up to  four times daily as directed. (FOR ICD-10 E10.9, E11.9). 09/19/20    Caren Griffins, MD  haloperidol (HALDOL) 0.5 MG tablet Take 0.5 mg by mouth every morning. Patient not taking: Reported on 11/08/2020    [provider]  insulin glargine (LANTUS) 100 UNIT/ML injection Inject 8 Units into the skin every morning. Patient not taking: Reported on 11/08/2020    [provider]  insulin glargine, 1 Unit Dial, (TOUJEO) 300 UNIT/ML Solostar Pen Inject 5 Units into the skin daily. Patient not taking: Reported on 11/08/2020 09/19/20   Caren Griffins, MD  Insulin Syringe-Needle U-100 (INSULIN SYRINGE .5CC/30GX1/2") 30G X 1/2" 0.5 ML MISC 1 each by Does not apply route daily. 09/19/20   Caren Griffins, MD   No Known Allergies Review of Systems  Unable to perform ROS: Mental status change   Physical Exam Constitutional:      General: He is awake.     Appearance: He is underweight. He is ill-appearing.  Cardiovascular:     Rate and Rhythm: Normal rate.  Skin:    General: Skin is warm and dry.  Neurological:     Mental Status: He is disoriented.    Vital Signs: BP (!) 156/65 (BP Location: Right Arm)   Pulse 62   Temp 97.9 F (36.6 C) (Oral)   Resp 18   Wt 81.6 kg   SpO2 98%   BMI 25.81 kg/m  Pain Scale: 0-10   Pain Score: 0-No pain   SpO2: SpO2: 98 % O2 Device:SpO2: 98 % O2 Flow Rate: .   IO: Intake/output summary:  Intake/Output Summary (Last 24 hours) at 11/10/2020 1016 Last data filed at 11/09/2020 2230 Gross per 24 hour  Intake 120 ml  Output 1050 ml  Net -930 ml    LBM: Last BM Date:  (PTA) Baseline Weight: Weight: 81.6 kg Most recent weight: Weight: 81.6 kg     Palliative Assessment/Data: 20 % at best   Discussed with Dr Alfredia Ferguson  Time In: 1430 Time Out: 1600 Time Total: 90 minutes Greater than 50%  of this time was spent counseling and coordinating care related to the above assessment and plan.  Signed by: Wadie Lessen, NP   Please contact Palliative Medicine Team phone at [phone] for questions and  concerns.  For individual provider: See Shea Evans

## 2020-11-11 DIAGNOSIS — I63511 Cerebral infarction due to unspecified occlusion or stenosis of right middle cerebral artery: Secondary | ICD-10-CM | POA: Diagnosis not present

## 2020-11-11 DIAGNOSIS — R52 Pain, unspecified: Secondary | ICD-10-CM | POA: Diagnosis not present

## 2020-11-11 DIAGNOSIS — Z515 Encounter for palliative care: Secondary | ICD-10-CM | POA: Diagnosis not present

## 2020-11-11 DIAGNOSIS — E875 Hyperkalemia: Secondary | ICD-10-CM | POA: Diagnosis not present

## 2020-11-11 DIAGNOSIS — E1122 Type 2 diabetes mellitus with diabetic chronic kidney disease: Secondary | ICD-10-CM | POA: Diagnosis not present

## 2020-11-11 DIAGNOSIS — F01511 Vascular dementia, unspecified severity, with agitation: Secondary | ICD-10-CM | POA: Diagnosis not present

## 2020-11-11 DIAGNOSIS — Z66 Do not resuscitate: Secondary | ICD-10-CM | POA: Diagnosis not present

## 2020-11-11 MED ORDER — MORPHINE SULFATE (CONCENTRATE) 10 MG/0.5ML PO SOLN
5.0000 mg | Freq: Four times a day (QID) | ORAL | Status: DC
  Administered 2020-11-11 – 2020-11-14 (×6): 5 mg via ORAL
  Filled 2020-11-11 (×6): qty 0.5

## 2020-11-11 NOTE — Progress Notes (Addendum)
PROGRESS NOTE    Randall [name]  DPO:[id number] DOB: 28-Nov-1945 DOA: 11/07/2020 PCP: Edmonia Caprio, NP  Brief Narrative:  The patient is a 75 year old overweight chronically ill-appearing Caucasian male with a past medical history significant for but not limited to vascular dementia, history of CVA, atrial fibrillation on anticoagulation with Eliquis as well as other comorbidities including diabetes mellitus type 2 and hypertension who presented with slurred speech and left arm weakness from his SNF.  He is only alert and oriented to self and only his answers no to every question on admission.  History is obtained from the ED documentation as well as ED physician initially and he was reportedly sent here for slurred speech and left arm weakness that happened around 630 the night before.  Last shock was in August with a left cerebral infarct.  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  He was evaluated several times this month for recurrent falls with negative work-up including CT head.  He presented as a code stroke and had findings of a new hypodensity in the right posterior limb of the internal capsule and right corona radiata on CT head.  He underwent further work-up and neurology was evaluated and recommended further stroke work-up.  He had an EEG done which was technically a difficult study but was suggestive of cortical dysfunction arising from the right temporal region likely secondary to his underlying structural abnormality and stroke but there is no evidence of seizure or epileptiform discharges seen throughout the recording.  Palliative care met with the patient and patient's family and because he is refusing to eat and not very interactive family decided to accept goals of care and transition to a comfort approach.  All medications on the patient's comfort were discontinued and we  will reevaluate and watch him closely and he may be a candidate for residential hospice.  Patient's IV fluids were discontinued.    He remained significantly confused and kept saying that he needed to "help" and wanted to get out of the bed and put that rail down.  Besides him being a little agitated he was in no acute distress.  Assessment & Plan:   Principal Problem:   CVA (cerebral vascular accident) [facility]) Active Problems:   Hyperkalemia   Type 2 diabetes mellitus ([place])   Vascular dementia ([place])  Acute CVA with right MCA territory infarcts infarct embolic secondary large vessel disease in the setting of hypotension -Neurology consulted and he had code stroke CT scan which showed new hypodensity in the right posterior limb of internal capsule and right corona radiata likely acute or subacute infarcts there is no hemorrhage -MRI of the brain showed a acute early subacute infarcts of the right MCA distribution primary involving the right corona radiata and remote infarcts in the left occipital lobe and some cerebellar hemisphere -MRI of the head and neck done and showed critical stenosis of right M1 segment with a trickle of flow of lead and has been transversing the stenosis similar to prior to CT a head allowing for differences in modality as well as focal high-grade stenosis of the left A2 segment and multifocal stenosis with overall diminished enhancement in the left PCA that was not significantly changed.  Patient also had a vasculature of the neck with no high-grade stenosis occlusion or dissection or aneurysm -EEG done and showed "This technically difficult study  is suggestive of cortical dysfunction arising from right temporal region. likely secondary to underlying structural abnormality/stroke. No seizures or epileptiform discharges were seen throughout the recording." -Echocardiogram done a few months ago showed an EF of 55 to 60%, moderate dilated left atrium and no IA shunt -Lipid  panel done and hemoglobin A1c obtained; lipid panel showed a total cholesterol/HDL panel of 5.6, cholesterol level 150, HDL of 27, LDL of 86, triglycerides of 185, and VLDL of 37 -Hemoglobin A1c was 7.6 -Patient was n.p.o. given his diet but now after SLP evaluation he has been placed on a dysphagia 1 diet with nectar thick liquids -PT OT and SLP consulted -Continue with frequent neurochecks and keep on telemetry -Per neurology the goal blood pressure is between 892-119 systolic given severe intracranial stenosis -We will resume his Eliquis and statin now that SLP recommends dysphagia 1 diet with nectar thick liquids -PT OT recommending SNF but he may be residential hospice candidate -Palliative care has been consulted for goals of care discussion given his vascular dementia and recurrent strokes; after further goals of care discussion with palliative the family decided to transition the patient to comfort measures and all medications not in the patient's comfort were discontinued -Likely will need Residential Hospice Placement  Hypertension -Neurology recommending goal blood pressure to be between 130-160 given severe intracranial stenosis -He is on carvedilol at home -Continue to monitor blood pressure per protocol however now that he is being transitioned to comfort care we will discontinue his antihypertensives -Last blood pressure reading was 181/83  Hyperlipidemia -Lipid panel as above -Continue with Atorvastatin 40 mg p.o. daily  Diabetes Mellitus Type 2 -Takes glipizide at the SNF -Recent hemoglobin A1c was 7.6 this admission and prior 1 was 8 and 08/2020 -Continue with sensitive Lovenox SQ insulin AC -Continue monitor CBGs per protocol; CBGs have been ranging from 76-121 but will not check again now that he is being transitioned to comfort  AKI on CKD stage 2 Metabolic acidosis -BUNs/creatinine is now 34/2.23 yesterday and improved further to 25/1.38 on last check -Continued  with IV fluid hydration with normal saline at 75 MLS per hour but now will be discontinued and the patient has been transitioned to comfort care -Yesterday a.m. he had a CO2 of 20, anion gap of 8, chloride level 113; will not repeat -Avoid nephrotoxic medications, contrast dyes, hypotension renally dose medications -Will not repeat CMP noted to be in transition to comfort  Normocytic anemia/anemia chronic disease -His hemoglobin/hematocrit went from 14.3/42.0 -> 12.5/41.1 -> 11.2/36.1 -Will not check anemia panel now but he had a B12 level drawn and it was 1192 -Continue to monitor for signs and symptoms bleeding; currently no overt bleeding noted -Will not repeat CBC now that he is being transitioned to comfort care   Hyperkalemia -mild K of 5.3-->4.8 and today it was 4.7 again -given Lokelma x 1 and is improved -Will not continue monitor and trend and will not repeat CMP in the a.m.  Hypoalbuminemia -Patient is albumin level 1 from 3.1 and trended down to 2.8 -Currently getting IV fluid hydration as above but now.  In the setting of transitioning to comfort  Vascular Dementia with behavioral disturbances and lethargy and refusal of care -Only oriented to self. Reportedly had behavior issues in the past and has been lethargic today and not really interactive and refusing care -Palliative care has been consulted for further goals of care discussion and after further goals of care discussion he is being transitioned to full  comfort measures and may be a candidate for residential hospice pending his clinical course -Has mitten restraints -Placed on delirium precautions  Goals of Care: -Palliative involved and he is a DNR and now they have been transitioned to full comfort measures and has a high risk for clinical decompensation   DVT prophylaxis: Apixaban Code Status: DO NOT RESUSCITATE  Family Communication: No family present at bedside by palliative had an extensive goals of care  discussion Disposition Plan: Possible residential hospice pending his clinical course Status is: Inpatient  Remains inpatient appropriate because: New CVA and will need palliative care discussion for further goals of care; now has been transitioned to comfort care and will need to be evaluated closely for further decompensation and possible placement and residential hospice  Consultants:  Neurology Palliative care medicine  Procedures:  ECHOCARDIOGRAM IMPRESSIONS     1. Left ventricular ejection fraction, by estimation, is 60 to 65%. The  left ventricle has normal function. The left ventricle demonstrates  regional wall motion abnormalities (see scoring diagram/findings for  description). There is moderate left  ventricular hypertrophy. Left ventricular diastolic function could not be  evaluated. There is akinesis of the left ventricular, basal-mid  inferolateral wall.   2. Right ventricular systolic function is mildly reduced. The right  ventricular size is normal.   3. The mitral valve is normal in structure. Trivial mitral valve  regurgitation. No evidence of mitral stenosis.   4. The aortic valve is tricuspid. Aortic valve regurgitation is not  visualized. No aortic stenosis is present.   FINDINGS   Left Ventricle: Left ventricular ejection fraction, by estimation, is 60  to 65%. The left ventricle has normal function. The left ventricle  demonstrates regional wall motion abnormalities. The left ventricular  internal cavity size was normal in size.  There is moderate left ventricular hypertrophy. Left ventricular diastolic  function could not be evaluated.   Right Ventricle: The right ventricular size is normal. Right ventricular  systolic function is mildly reduced.   Left Atrium: Left atrial size was normal in size.   Right Atrium: Right atrial size was normal in size.   Pericardium: There is no evidence of pericardial effusion.   Mitral Valve: The mitral valve is  normal in structure. Mild mitral annular  calcification. Trivial mitral valve regurgitation. No evidence of mitral  valve stenosis.   Tricuspid Valve: The tricuspid valve is normal in structure. Tricuspid  valve regurgitation is mild . No evidence of tricuspid stenosis.   Aortic Valve: The aortic valve is tricuspid. Aortic valve regurgitation is  not visualized. No aortic stenosis is present.   Pulmonic Valve: The pulmonic valve was not assessed.   Aorta: The aortic root is normal in size and structure.   Venous: The inferior vena cava was not well visualized.   IAS/Shunts: No atrial level shunt detected by color flow Doppler.   LEFT VENTRICLE  PLAX 2D  LVIDd:         4.80 cm  LVIDs:         3.70 cm  LV PW:         1.30 cm  LV IVS:        1.80 cm  LVOT diam:     2.10 cm  LVOT Area:     3.46 cm      LEFT ATRIUM         Index  LA diam:    4.40 cm 2.21 cm/m   MITRAL VALVE  TRICUSPID VALVE  MV Area (PHT): 2.54 cm     TR Peak grad:   24.2 mmHg  MV Decel Time: 299 msec     TR Vmax:        246.00 cm/s  MV E velocity: 67.90 cm/s  MV A velocity: 128.00 cm/s  SHUNTS  MV E/A ratio:  0.53         Systemic Diam: 2.10 cm   Antimicrobials:  Anti-infectives (From admission, onward)    None        Subjective: Seen and examined at bedside and he remained confused and was more agitated and wanting to get out of bed.  He states that he needed "help" and he needed help getting out of the bed to the other side of the room.  He wanted me to pull out his safety rail and states that I should "unplug it".  Did not want to eat earlier as he stated he was "not hungry".  No nausea or vomiting.  No other concerns or questions time and still remains extremely demented and confused.  Objective: Vitals:   11/10/20 0800 11/10/20 1108 11/10/20 2021 11/11/20 0505  BP: (!) 156/65 (!) 166/48 (!) 151/55 (!) 181/83  Pulse: 62 62 63 66  Resp: _0 Temp: 97.9 F (36.6 C) 97.9 F  (36.6 C) 98.3 F (36.8 C) 98 F (36.7 C)  TempSrc: Oral Oral Oral Oral  SpO2:  97% 98% 99%  Weight:        Intake/Output Summary (Last 24 hours) at 11/11/2020 1240 Last data filed at 11/11/2020 0800 Gross per 24 hour  Intake 10 ml  Output 150 ml  Net -140 ml    Filed Weights   11/07/20 2228  Weight: 81.6 kg   Examination: Physical Exam:  Constitutional: WN/WD overweight demented Caucasian male appears a little agitated and uncomfortable wanting to get out of bed Eyes: Lids and conjunctivae normal, sclerae anicteric  ENMT: External Ears, Nose appear normal. Grossly normal hearing.  Neck: Appears normal, supple, no cervical masses, normal ROM, no appreciable thyromegaly; no appreciable JVD Respiratory: Diminished to auscultation bilaterally, no wheezing, rales, rhonchi or crackles. Normal respiratory effort and patient is not tachypenic. No accessory muscle use.  Unlabored breathing Cardiovascular: RRR, no murmurs / rubs / gallops. S1 and S2 auscultated. Abdomen: Soft, non-tender, distended slightly secondary body habitus. Bowel sounds positive.  GU: Deferred. Musculoskeletal: No clubbing / cyanosis of digits/nails. No joint deformity upper and lower extremities.  Skin: No rashes, lesions, ulcers. No induration; Warm and dry.  Neurologic: CN 2-12 grossly intact with no focal deficits. Romberg sign and cerebellar reflexes not assessed.  Psychiatric: Impaired judgment and insight. Alert and oriented x 1. A little anxious and agitated..   Data Reviewed: I have personally reviewed following labs and imaging studies  CBC: Recent Labs  Lab 11/07/20 2221 11/07/20 2226 11/09/20 0930 11/10/20 0903  WBC 7.7  --  6.4 5.5  NEUTROABS 5.8  --  4.2 3.6  HGB 13.2 14.3 12.5* 11.2*  HCT 43.6 42.0 41.1 36.1*  MCV 97.3  --  95.1 94.8  PLT 253  --  224 818    Basic Metabolic Panel: Recent Labs  Lab 11/07/20 2221 11/07/20 2226 11/08/20 1010 11/09/20 0201 11/09/20 0930  11/10/20 0903  NA 139 141 140 142  --  141  K 5.5* 5.3* 4.8 4.7  --  4.7  CL 104 106 108 109  --  113*  CO2 23  --  22 24  --  20*  GLUCOSE 150* 150* 102* 86  --  118*  BUN 39* 40* 38* 34*  --  25*  CREATININE 3.33* 3.60* 3.03* 2.23*  --  1.38*  CALCIUM 9.0  --  8.4* 8.7*  --  8.4*  MG  --   --   --   --  2.4 2.1  PHOS  --   --   --   --  3.3 2.6    GFR: Estimated Creatinine Clearance: 46.3 mL/min (A) (by C-G formula based on SCr of 1.38 mg/dL (H)). Liver Function Tests: Recent Labs  Lab 11/07/20 2221 11/08/20 1010 11/09/20 0201 11/10/20 0903  AST _0 ALT _1 ALKPHOS 102 87 93 81  BILITOT 0.5 0.6 0.7 0.9  PROT 7.1 6.1* 6.5 6.1*  ALBUMIN 3.4* 2.9* 3.1* 2.8*    No results for input(s): LIPASE, AMYLASE in the last 168 hours. Recent Labs  Lab 11/09/20 0930  AMMONIA 19    Coagulation Profile: Recent Labs  Lab 11/07/20 2221  INR 1.7*    Cardiac Enzymes: No results for input(s): CKTOTAL, CKMB, CKMBINDEX, TROPONINI in the last 168 hours. BNP (last 3 results) No results for input(s): PROBNP in the last 8760 hours. HbA1C: No results for input(s): HGBA1C in the last 72 hours.  CBG: Recent Labs  Lab 11/09/20 1551 11/09/20 2107 11/10/20 0549 11/10/20 1109 11/10/20 1634  GLUCAP 97 108* 76 112* 121*    Lipid Profile: No results for input(s): CHOL, HDL, LDLCALC, TRIG, CHOLHDL, LDLDIRECT in the last 72 hours.  Thyroid Function Tests: Recent Labs    11/09/20 0930  TSH 0.715    Anemia Panel: Recent Labs    11/09/20 0930  VITAMINB12 1,192*    Sepsis Labs: No results for input(s): PROCALCITON, LATICACIDVEN in the last 168 hours.  Recent Results (from the past 240 hour(s))  Resp Panel by RT-PCR (Flu A&B, Covid) Nasopharyngeal Swab     Status: None   Collection Time: 11/08/20  2:06 AM   Specimen: Nasopharyngeal Swab; Nasopharyngeal(NP) swabs in vial transport medium  Result Value Ref Range Status   SARS Coronavirus 2 by RT PCR  NEGATIVE NEGATIVE Final    Comment: (NOTE) SARS-CoV-2 target nucleic acids are NOT DETECTED.  The SARS-CoV-2 RNA is generally detectable in upper respiratory specimens during the acute phase of infection. The lowest concentration of SARS-CoV-2 viral copies this assay can detect is 138 copies/mL. A negative result does not preclude SARS-Cov-2 infection and should not be used as the sole basis for treatment or other patient management decisions. A negative result may occur with  improper specimen collection/handling, submission of specimen other than nasopharyngeal swab, presence of viral mutation(s) within the areas targeted by this assay, and inadequate number of viral copies(<138 copies/mL). A negative result must be combined with clinical observations, patient history, and epidemiological information. The expected result is Negative.  Fact Sheet for Patients:  EntrepreneurPulse.com.au  Fact Sheet for Healthcare Providers:  IncredibleEmployment.be  This test is no t yet approved or cleared by the [place] FDA and  has been authorized for detection and/or diagnosis of SARS-CoV-2 by FDA under an Emergency Use Authorization (EUA). This EUA will remain  in effect (meaning this test can be used) for the duration of the COVID-19 declaration under Section 564(b)(1) of the Act, 21 U.S.C.section 360bbb-3(b)(1), unless the authorization is terminated  or revoked sooner.       Influenza A by PCR NEGATIVE NEGATIVE  Final   Influenza B by PCR NEGATIVE NEGATIVE Final    Comment: (NOTE) The Xpert Xpress SARS-CoV-2/FLU/RSV plus assay is intended as an aid in the diagnosis of influenza from Nasopharyngeal swab specimens and should not be used as a sole basis for treatment. Nasal washings and aspirates are unacceptable for Xpert Xpress SARS-CoV-2/FLU/RSV testing.  Fact Sheet for Patients: EntrepreneurPulse.com.au  Fact Sheet for  Healthcare Providers: IncredibleEmployment.be  This test is not yet approved or cleared by the [place] FDA and has been authorized for detection and/or diagnosis of SARS-CoV-2 by FDA under an Emergency Use Authorization (EUA). This EUA will remain in effect (meaning this test can be used) for the duration of the COVID-19 declaration under Section 564(b)(1) of the Act, 21 U.S.C. section 360bbb-3(b)(1), unless the authorization is terminated or revoked.  Performed at [facility], [place] [address] [place], [place] [address]      RN Pressure Injury Documentation:     Estimated body mass index is 25.81 kg/m as calculated from the following:   Height as of 10/26/20: _0  (1.778 m).   Weight as of this encounter: 81.6 kg.  Malnutrition Type:   Malnutrition Characteristics:   Nutrition Interventions:   Radiology Studies: EEG adult  Result Date: Dec 03, 2020 Lora Havens, MD     2020/12/03  9:26 PM Patient Name: Randall [name] MRN: [id number] Epilepsy Attending: Lora Havens Referring Physician/Provider: Dr Carlisle Cater Date: 12-03-2020 Duration: 21.24 mins Patient history: 75yo M with dementia, acute Right MCA infarct with ams. EEG to evaluate for seizure. Level of alertness: Awake AEDs during EEG study: None Technical aspects: This EEG study was done with scalp electrodes positioned according to the 10-20 International system of electrode placement. Electrical activity was acquired at a sampling rate of 500Hz and reviewed with a high frequency filter of 70Hz and a low frequency filter of 1Hz. EEG data were recorded continuously and digitally stored. Description: The posterior dominant rhythm consists of 8 Hz activity of moderate voltage (25-35 uV) seen predominantly in posterior head regions, symmetric and reactive to eye opening and eye closing. EEG showed continuous 3 to 6 Hz theta-delta slowing in right temporal region.  Hyperventilation and photic  stimulation were not performed.   Of note, this study was technically difficult due to significant movement artifact. ABNORMALITY - Continuous slow, right temporal region. IMPRESSION: This technically difficult study is suggestive of cortical dysfunction arising from right temporal region. likely secondary to underlying structural abnormality/stroke. No seizures or epileptiform discharges were seen throughout the recording. Priyanka Barbra Sarks    Scheduled Meds:   stroke: mapping our early stages of recovery book   Does not apply Once   haloperidol  0.5 mg Oral q morning   pantoprazole  40 mg Oral BID AC   sertraline  50 mg Oral Daily   sodium chloride flush  3 mL Intravenous Once   Continuous Infusions:   LOS: 3 days   Kerney Elbe, DO [facility] PAGER is on [place]  If 7PM-7AM, please contact night-coverage [email or web address]

## 2020-11-11 NOTE — Care Management Important Message (Signed)
Important Message  Patient Details  Name: Randall [name] MRN: [id number] Date of Birth: September 19, 1945   Medicare Important Message Given:  Yes     [name] Stefan Church 11/11/2020, 3:54 PM

## 2020-11-11 NOTE — Progress Notes (Signed)
Patient ID: [name], male   DOB: March 20, 1945, 75 y.o.   MRN: [id number]    Progress Note from the Palliative Medicine Team at [facility]   Patient Name: Randall [name]        Date: 11/11/2020 DOB: 1945/06/09  Age: 75 y.o. MRN#: [id number] Attending Physician: Merlene Laughter, DO Primary Care Physician: Ashley Royalty, NP Admit Date: 11/07/2020   Medical records reviewed   75 y.o. male  admitted on 11/07/2020 with past medical history significant for vascular dementia, history of CVA, atrial fibrillation on Eliquis who presents from skilled nursing facility with concerns of slurred speech and left arm weakness.   Patient alert and oriented only to self and answers no to every question.  History obtained from ED documentation ED physician.  Reportedly he was sent here by his facility for slurred vision and left arm weakness at 1830.  Last stroke in August with left cerebral infarct.  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    More recently, he has been evaluated several times this month for recurrent fall with negative work-up including CT head.   Family report dramatic physical, functional and cognitive decline over the past 2 months.      ED Course: Presented as a code stroke and had findings of new hypodensity in the right posterior limb of the internal capsule and right corona radiata on CT head.   After careful consideration and review of patient's Declaration for a Natural Death document family made decision to forgo life prolong measures and allow a natural death yesterday.    This NP visited patient at the bedside as a follow up for palliative medicine needs and emotional support.  Patient is lethargic, taking only sips and appears generally uncomfortable  Spoke to son/Brandon by phone.  Family is comfortable with decision for full comfort path  Plan of  Care: -Focus of care is comfort, quality and dignity  -No artifical feeding or hydration now or in the future/ sips/chips as tolerated -Symptom management         -Roxanol 5 mg po/sl every 6 hrs scheduled for underlying generalized pain and every 2 hrs prn  -Family is hopeful for residential hospice for EOL care, requesting [facility] -Prognosis is less than 2 weeks    Education offered on natural trajectory and expectations at EOL  Questions and concerns addressed   Discussed with Dr Marland Mcalpine  Total time spent on the unit was 40 minutes  Greater than 50% of the time was spent in counseling and coordination of care  Lorinda Creed NP  Palliative Medicine Team Team Phone # [phone] Pager [phone]

## 2020-11-12 DIAGNOSIS — I63511 Cerebral infarction due to unspecified occlusion or stenosis of right middle cerebral artery: Secondary | ICD-10-CM | POA: Diagnosis not present

## 2020-11-12 DIAGNOSIS — E875 Hyperkalemia: Secondary | ICD-10-CM | POA: Diagnosis not present

## 2020-11-12 DIAGNOSIS — N179 Acute kidney failure, unspecified: Secondary | ICD-10-CM | POA: Diagnosis not present

## 2020-11-12 DIAGNOSIS — E1122 Type 2 diabetes mellitus with diabetic chronic kidney disease: Secondary | ICD-10-CM | POA: Diagnosis not present

## 2020-11-12 NOTE — Progress Notes (Signed)
Civil engineer, contracting [facility]) Hospital Liaison Note  Received request from [facility] for family interest in [facility]. Chart reviewed and hospice eligibility is pending at this time.  Spoke with patient son to acknowledge referral and answer questions about [facility] and Hospice philosophy.   Unfortunately [facility] is not able to offer a room today. Family and TOC are aware [facility] Liaison will follow up with [facility] and family tomorrow or sooner if room becomes available.  Please do not hesitate to call with questions.  Thank you.   Roda Shutters, RN [facility] Liaison  [phone] [facility] are on [place]

## 2020-11-12 NOTE — Progress Notes (Signed)
PROGRESS NOTE    Randall [name]  EPP:[id number] DOB: Aug 23, 1945 DOA: 11/07/2020 PCP: Edmonia Caprio, NP  Brief Narrative:  The patient is a 75 year old overweight chronically ill-appearing Caucasian male with a past medical history significant for but not limited to vascular dementia, history of CVA, atrial fibrillation on anticoagulation with Eliquis as well as other comorbidities including diabetes mellitus type 2 and hypertension who presented with slurred speech and left arm weakness from his SNF.  He is only alert and oriented to self and only his answers no to every question on admission.  History is obtained from the ED documentation as well as ED physician initially and he was reportedly sent here for slurred speech and left arm weakness that happened around 630 the night before.  Last shock was in August with a left cerebral infarct.  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  He was evaluated several times this month for recurrent falls with negative work-up including CT head.  He presented as a code stroke and had findings of a new hypodensity in the right posterior limb of the internal capsule and right corona radiata on CT head.  He underwent further work-up and neurology was evaluated and recommended further stroke work-up.  He had an EEG done which was technically a difficult study but was suggestive of cortical dysfunction arising from the right temporal region likely secondary to his underlying structural abnormality and stroke but there is no evidence of seizure or epileptiform discharges seen throughout the recording.  Palliative care met with the patient and patient's family and because he is refusing to eat and not very interactive family decided to accept goals of care and transition to a comfort approach.  All medications on the patient's comfort were discontinued and we  will reevaluate and watch him closely and he may be a candidate for residential hospice.  Patient's IV fluids were discontinued.    He remained significantly confused yesterday and kept saying that he needed to "help" and wanted to get out of the bed and put that rail down.  Besides him being a little agitated he was in no acute distress and today he is calmer. Still awaiting hospice eligibility and currently there is no bed available at [facility] today with the hospital liaison for office for follow-up with the family tomorrow or sooner if room becomes available.  Assessment & Plan:   Principal Problem:   CVA (cerebral vascular accident) [facility]) Active Problems:   Hyperkalemia   Type 2 diabetes mellitus ([place])   Vascular dementia ([place])  Acute CVA with right MCA territory infarcts infarct embolic secondary large vessel disease in the setting of hypotension -Neurology consulted and he had code stroke CT scan which showed new hypodensity in the right posterior limb of internal capsule and right corona radiata likely acute or subacute infarcts there is no hemorrhage -MRI of the brain showed a acute early subacute infarcts of the right MCA distribution primary involving the right corona radiata and remote infarcts in the left occipital lobe and some cerebellar hemisphere -MRI of the head and neck done and showed critical stenosis of right M1 segment with a trickle of flow of lead and has been transversing the stenosis similar to prior to CT a head allowing for differences in modality as well as focal high-grade stenosis of the left A2 segment and multifocal stenosis  with overall diminished enhancement in the left PCA that was not significantly changed.  Patient also had a vasculature of the neck with no high-grade stenosis occlusion or dissection or aneurysm -EEG done and showed "This technically difficult study is suggestive of cortical dysfunction arising from right temporal region. likely secondary  to underlying structural abnormality/stroke. No seizures or epileptiform discharges were seen throughout the recording." -Echocardiogram done a few months ago showed an EF of 55 to 60%, moderate dilated left atrium and no IA shunt -Lipid panel done and hemoglobin A1c obtained; lipid panel showed a total cholesterol/HDL panel of 5.6, cholesterol level 150, HDL of 27, LDL of 86, triglycerides of 185, and VLDL of 37 -Hemoglobin A1c was 7.6 -Patient was n.p.o. given his diet but now after SLP evaluation he has been placed on a dysphagia 1 diet with nectar thick liquids -PT OT and SLP consulted -Continue with frequent neurochecks and keep on telemetry -Per neurology the goal blood pressure is between 543-606 systolic given severe intracranial stenosis -We will resume his Eliquis and statin now that SLP recommends dysphagia 1 diet with nectar thick liquids -PT OT recommending SNF but he may be residential hospice candidate -Palliative care has been consulted for goals of care discussion given his vascular dementia and recurrent strokes; after further goals of care discussion with palliative the family decided to transition the patient to comfort measures and all medications not in the patient's comfort were discontinued -Likely will need Residential Hospice Placement  Hypertension -Neurology recommending goal blood pressure to be between 130-160 given severe intracranial stenosis -He is on carvedilol at home -Continue to monitor blood pressure per protocol however now that he is being transitioned to comfort care we will discontinue his antihypertensives -Last blood pressure reading was 156/81  Hyperlipidemia -Lipid panel as above -Continue with Atorvastatin 40 mg p.o. daily  Diabetes Mellitus Type 2 -Takes glipizide at the SNF -Recent hemoglobin A1c was 7.6 this admission and prior 1 was 8 and 08/2020 -Continue with sensitive Lovenox SQ insulin AC -Continue monitor CBGs per protocol; CBGs have  been ranging from 76-121 but will not check again now that he is being transitioned to comfort  AKI on CKD stage 2 Metabolic acidosis -BUNs/creatinine is now 34/2.23 yesterday and improved further to 25/1.38 on last check -Continued with IV fluid hydration with normal saline at 75 MLS per hour but now will be discontinued and the patient has been transitioned to comfort care -Yesterday a.m. he had a CO2 of 20, anion gap of 8, chloride level 113; will not repeat -Avoid nephrotoxic medications, contrast dyes, hypotension renally dose medications -Will not repeat CMP noted to be in transition to comfort  Normocytic anemia/anemia chronic disease -His hemoglobin/hematocrit went from 14.3/42.0 -> 12.5/41.1 -> 11.2/36.1 on last check  -Will not check anemia panel now but he had a B12 level drawn and it was 1192 -Continue to monitor for signs and symptoms bleeding; currently no overt bleeding noted -Will not repeat CBC now that he is being transitioned to comfort care   Hyperkalemia -mild K of 5.3-->4.8 and today it was 4.7 again -given Lokelma x 1 and is improved -Will not continue monitor and trend and will not repeat CMP in the a.m.  Hypoalbuminemia -Patient is albumin level 1 from 3.1 and trended down to 2.8 -Currently getting IV fluid hydration as above but now.  In the setting of transitioning to comfort  Vascular Dementia with behavioral disturbances and lethargy and refusal of care -Only oriented to self.  Reportedly had behavior issues in the past and has been lethargic today and not really interactive and refusing care -Palliative care has been consulted for further goals of care discussion and after further goals of care discussion he is being transitioned to full comfort measures and may be a candidate for residential hospice pending his clinical course -Has mitten restraints -Placed on delirium precautions  Goals of Care: -Palliative involved and he is a DNR and now they have been  transitioned to full comfort measures and has a high risk for clinical decompensation   DVT prophylaxis: Apixaban Code Status: DO NOT RESUSCITATE  Family Communication: No family present at bedside by palliative had an extensive goals of care discussion Disposition Plan: Possible residential hospice pending his clinical course Status is: Inpatient  Remains inpatient appropriate because: New CVA and will need palliative care discussion for further goals of care; now has been transitioned to comfort care and will need to be evaluated closely for further decompensation and possible placement and residential hospice  Consultants:  Neurology Palliative care medicine  Procedures:  ECHOCARDIOGRAM IMPRESSIONS     1. Left ventricular ejection fraction, by estimation, is 60 to 65%. The  left ventricle has normal function. The left ventricle demonstrates  regional wall motion abnormalities (see scoring diagram/findings for  description). There is moderate left  ventricular hypertrophy. Left ventricular diastolic function could not be  evaluated. There is akinesis of the left ventricular, basal-mid  inferolateral wall.   2. Right ventricular systolic function is mildly reduced. The right  ventricular size is normal.   3. The mitral valve is normal in structure. Trivial mitral valve  regurgitation. No evidence of mitral stenosis.   4. The aortic valve is tricuspid. Aortic valve regurgitation is not  visualized. No aortic stenosis is present.   FINDINGS   Left Ventricle: Left ventricular ejection fraction, by estimation, is 60  to 65%. The left ventricle has normal function. The left ventricle  demonstrates regional wall motion abnormalities. The left ventricular  internal cavity size was normal in size.  There is moderate left ventricular hypertrophy. Left ventricular diastolic  function could not be evaluated.   Right Ventricle: The right ventricular size is normal. Right ventricular   systolic function is mildly reduced.   Left Atrium: Left atrial size was normal in size.   Right Atrium: Right atrial size was normal in size.   Pericardium: There is no evidence of pericardial effusion.   Mitral Valve: The mitral valve is normal in structure. Mild mitral annular  calcification. Trivial mitral valve regurgitation. No evidence of mitral  valve stenosis.   Tricuspid Valve: The tricuspid valve is normal in structure. Tricuspid  valve regurgitation is mild . No evidence of tricuspid stenosis.   Aortic Valve: The aortic valve is tricuspid. Aortic valve regurgitation is  not visualized. No aortic stenosis is present.   Pulmonic Valve: The pulmonic valve was not assessed.   Aorta: The aortic root is normal in size and structure.   Venous: The inferior vena cava was not well visualized.   IAS/Shunts: No atrial level shunt detected by color flow Doppler.   LEFT VENTRICLE  PLAX 2D  LVIDd:         4.80 cm  LVIDs:         3.70 cm  LV PW:         1.30 cm  LV IVS:        1.80 cm  LVOT diam:     2.10 cm  LVOT Area:     3.46 cm      LEFT ATRIUM         Index  LA diam:    4.40 cm 2.21 cm/m   MITRAL VALVE                TRICUSPID VALVE  MV Area (PHT): 2.54 cm     TR Peak grad:   24.2 mmHg  MV Decel Time: 299 msec     TR Vmax:        246.00 cm/s  MV E velocity: 67.90 cm/s  MV A velocity: 128.00 cm/s  SHUNTS  MV E/A ratio:  0.53         Systemic Diam: 2.10 cm   Antimicrobials:  Anti-infectives (From admission, onward)    None        Subjective: Seen and examined at bedside and he remains confused but was calmer today and in no acute distress.  Wanting to rest.  Denies any lightheadedness or dizziness.  No other concerns or plans this time and currently still has no bed offer hospice.  Objective: Vitals:   11/11/20 1601 11/11/20 2159 11/12/20 0700 11/12/20 1245  BP: 132/66 (!) 193/84 (!) 188/88 (!) 156/81  Pulse: 67 66 69 68  Resp: _0 Temp:   97.8 F (36.6 C) 97.8 F (36.6 C) 98.5 F (36.9 C)  TempSrc:  Oral Axillary Oral  SpO2: 99% 99% 100% 98%  Weight:        Intake/Output Summary (Last 24 hours) at 11/12/2020 1517 Last data filed at 11/12/2020 1328 Gross per 24 hour  Intake 0 ml  Output 200 ml  Net -200 ml    Filed Weights   11/07/20 2228  Weight: 81.6 kg   Examination: Physical Exam:  Constitutional: Well Nourished, well developed overweight demented Caucasian male in no acute distress appears calm today resting  Respiratory: Slightly diminished to auscultation bilaterally no appreciable wheezing, rales, rhonchi.  No appreciable crackles either.  Unlabored breathing not wearing supplemental oxygen nasal cannula Cardiovascular: Regular rate and rhythm. Abdomen: Soft, nontender, distended slightly secondary body habitus.  Bowel sounds present habitus.  Psychiatric: Impaired judgment and insight.  Patient is awake and alert and oriented x1.  Appears calm  Data Reviewed: I have personally reviewed following labs and imaging studies  CBC: Recent Labs  Lab 11/07/20 2221 11/07/20 2226 11/09/20 0930 11/10/20 0903  WBC 7.7  --  6.4 5.5  NEUTROABS 5.8  --  4.2 3.6  HGB 13.2 14.3 12.5* 11.2*  HCT 43.6 42.0 41.1 36.1*  MCV 97.3  --  95.1 94.8  PLT 253  --  224 924    Basic Metabolic Panel: Recent Labs  Lab 11/07/20 2221 11/07/20 2226 11/08/20 1010 11/09/20 0201 11/09/20 0930 11/10/20 0903  NA 139 141 140 142  --  141  K 5.5* 5.3* 4.8 4.7  --  4.7  CL 104 106 108 109  --  113*  CO2 23  --  22 24  --  20*  GLUCOSE 150* 150* 102* 86  --  118*  BUN 39* 40* 38* 34*  --  25*  CREATININE 3.33* 3.60* 3.03* 2.23*  --  1.38*  CALCIUM 9.0  --  8.4* 8.7*  --  8.4*  MG  --   --   --   --  2.4 2.1  PHOS  --   --   --   --  3.3 2.6    GFR:  Estimated Creatinine Clearance: 46.3 mL/min (A) (by C-G formula based on SCr of 1.38 mg/dL (H)). Liver Function Tests: Recent Labs  Lab 11/07/20 2221 11/08/20 1010  11/09/20 0201 11/10/20 0903  AST _0 ALT _1 ALKPHOS 102 87 93 81  BILITOT 0.5 0.6 0.7 0.9  PROT 7.1 6.1* 6.5 6.1*  ALBUMIN 3.4* 2.9* 3.1* 2.8*    No results for input(s): LIPASE, AMYLASE in the last 168 hours. Recent Labs  Lab 11/09/20 0930  AMMONIA 19    Coagulation Profile: Recent Labs  Lab 11/07/20 2221  INR 1.7*    Cardiac Enzymes: No results for input(s): CKTOTAL, CKMB, CKMBINDEX, TROPONINI in the last 168 hours. BNP (last 3 results) No results for input(s): PROBNP in the last 8760 hours. HbA1C: No results for input(s): HGBA1C in the last 72 hours.  CBG: Recent Labs  Lab 11/09/20 1551 11/09/20 2107 11/10/20 0549 11/10/20 1109 11/10/20 1634  GLUCAP 97 108* 76 112* 121*    Lipid Profile: No results for input(s): CHOL, HDL, LDLCALC, TRIG, CHOLHDL, LDLDIRECT in the last 72 hours.  Thyroid Function Tests: No results for input(s): TSH, T4TOTAL, FREET4, T3FREE, THYROIDAB in the last 72 hours.  Anemia Panel: No results for input(s): VITAMINB12, FOLATE, FERRITIN, TIBC, IRON, RETICCTPCT in the last 72 hours.  Sepsis Labs: No results for input(s): PROCALCITON, LATICACIDVEN in the last 168 hours.  Recent Results (from the past 240 hour(s))  Resp Panel by RT-PCR (Flu A&B, Covid) Nasopharyngeal Swab     Status: None   Collection Time: 11/08/20  2:06 AM   Specimen: Nasopharyngeal Swab; Nasopharyngeal(NP) swabs in vial transport medium  Result Value Ref Range Status   SARS Coronavirus 2 by RT PCR NEGATIVE NEGATIVE Final    Comment: (NOTE) SARS-CoV-2 target nucleic acids are NOT DETECTED.  The SARS-CoV-2 RNA is generally detectable in upper respiratory specimens during the acute phase of infection. The lowest concentration of SARS-CoV-2 viral copies this assay can detect is 138 copies/mL. A negative result does not preclude SARS-Cov-2 infection and should not be used as the sole basis for treatment or other patient management decisions. A  negative result may occur with  improper specimen collection/handling, submission of specimen other than nasopharyngeal swab, presence of viral mutation(s) within the areas targeted by this assay, and inadequate number of viral copies(<138 copies/mL). A negative result must be combined with clinical observations, patient history, and epidemiological information. The expected result is Negative.  Fact Sheet for Patients:  EntrepreneurPulse.com.au  Fact Sheet for Healthcare Providers:  IncredibleEmployment.be  This test is no t yet approved or cleared by the [place] FDA and  has been authorized for detection and/or diagnosis of SARS-CoV-2 by FDA under an Emergency Use Authorization (EUA). This EUA will remain  in effect (meaning this test can be used) for the duration of the COVID-19 declaration under Section 564(b)(1) of the Act, 21 U.S.C.section 360bbb-3(b)(1), unless the authorization is terminated  or revoked sooner.       Influenza A by PCR NEGATIVE NEGATIVE Final   Influenza B by PCR NEGATIVE NEGATIVE Final    Comment: (NOTE) The Xpert Xpress SARS-CoV-2/FLU/RSV plus assay is intended as an aid in the diagnosis of influenza from Nasopharyngeal swab specimens and should not be used as a sole basis for treatment. Nasal washings and aspirates are unacceptable for Xpert Xpress SARS-CoV-2/FLU/RSV testing.  Fact Sheet for Patients: EntrepreneurPulse.com.au  Fact Sheet for Healthcare Providers: IncredibleEmployment.be  This test is not yet approved or  cleared by the [place] and has been authorized for detection and/or diagnosis of SARS-CoV-2 by FDA under an Emergency Use Authorization (EUA). This EUA will remain in effect (meaning this test can be used) for the duration of the COVID-19 declaration under Section 564(b)(1) of the Act, 21 U.S.C. section 360bbb-3(b)(1), unless the authorization  is terminated or revoked.  Performed at [facility], [place] [address], [place], [place] [address]     RN Pressure Injury Documentation:     Estimated body mass index is 25.81 kg/m as calculated from the following:   Height as of 10/26/20: _0  (1.778 m).   Weight as of this encounter: 81.6 kg.  Malnutrition Type:   Malnutrition Characteristics:   Nutrition Interventions:   Radiology Studies: No results found.  Scheduled Meds:  haloperidol  0.5 mg Oral q morning   morphine CONCENTRATE  5 mg Oral Q6H   sodium chloride flush  3 mL Intravenous Once   Continuous Infusions:   LOS: 4 days   Kerney Elbe, DO [facility] PAGER is on AMION  If 7PM-7AM, please contact night-coverage [email or web address]

## 2020-11-13 DIAGNOSIS — I63511 Cerebral infarction due to unspecified occlusion or stenosis of right middle cerebral artery: Secondary | ICD-10-CM | POA: Diagnosis not present

## 2020-11-13 DIAGNOSIS — N179 Acute kidney failure, unspecified: Secondary | ICD-10-CM | POA: Diagnosis not present

## 2020-11-13 NOTE — Progress Notes (Signed)
Civil engineer, contracting [facility])  Hospice hospital Liaison RN Note   Patient has been approved for [facility].    Unfortunately [facility] is not able to offer a room today. Family and TOC are aware [facility] Liaison will follow up with [facility] and family tomorrow or sooner if room becomes available.   Please do not hesitate to call with questions.   Thank you.  Thea Gist, BSN, Charity fundraiser, [facility] Hospice hospital liaison [phone]

## 2020-11-13 NOTE — TOC Initial Note (Signed)
Transition of Care [facility]) - Initial/Assessment Note    Patient Details  Name: Randall [name] MRN: [id number] Date of Birth: 10-Oct-1945  [facility] CM/SW Contact:    Kermit Balo, RN Phone Number: 11/13/2020, 12:26 PM  Clinical Narrative:                 [facility] consulted for residential hospice at [facility]. Referral sent out yesterday to [facility]. No beds available yesterday or today. CM following.   Expected Discharge Plan: Hospice Medical Facility Barriers to Discharge: Continued Medical Work up   Patient Goals and CMS Choice   CMS Medicare.gov Compare Post Acute Care list provided to:: Patient Represenative (must comment)    Expected Discharge Plan and Services Expected Discharge Plan: Hospice Medical Facility       Living arrangements for the past 2 months: Assisted Living Facility                                      Prior Living Arrangements/Services Living arrangements for the past 2 months: Assisted Living Facility Lives with:: Facility Resident Patient language and need for interpreter reviewed:: Yes Do you feel safe going back to the place where you live?: Yes      Need for Family Participation in Patient Care: Yes (Comment) Care giver support system in place?: Yes (comment)   Criminal Activity/Legal Involvement Pertinent to Current Situation/Hospitalization: No - Comment as needed  Activities of Daily Living      Permission Sought/Granted                  Emotional Assessment Appearance:: Appears stated age         Psych Involvement: No (comment)  Admission diagnosis:  CVA (cerebral vascular accident) (HCC) [I63.9] AKI (acute kidney injury) (HCC) [N17.9] Cerebrovascular accident (CVA), unspecified mechanism (HCC) [I63.9] Patient Active Problem List   Diagnosis Date Noted   Hyperkalemia 11/08/2020   Type 2 diabetes mellitus (HCC) 11/08/2020   Vascular dementia (HCC) 11/08/2020   CVA (cerebral vascular accident)  (HCC) 11/07/2020   Stroke (cerebrum) (HCC) 09/19/2020   Acute CVA (cerebrovascular accident) (HCC) 09/17/2020   AKI (acute kidney injury) (HCC) 09/17/2020   Normocytic anemia 09/17/2020   PCP:  Ashley Royalty, NP Pharmacy:   [facility] [place], [place] - [address] [place] [address] Phone: [phone] Fax: [phone]     Social Determinants of Health (SDOH) Interventions    Readmission Risk Interventions No flowsheet data found.

## 2020-11-13 NOTE — Progress Notes (Signed)
PROGRESS NOTE    Randall [name]  VFI:[id number] DOB: 1945-01-30 DOA: 11/07/2020 PCP: Edmonia Caprio, NP  Brief Narrative:  [age] with history of vascular dementia, history of CVA, paroxysmal A. fib on anticoagulation, type 2 diabetes mellitus, hypertension was brought to the ED with slurred speech and left-sided weakness from his facility  He was evaluated several times this month for recurrent falls with negative work-up including CT head.  He presented as a code stroke and had findings of a new hypodensity in the right posterior limb of the internal capsule and right corona radiata on CT head.  Underwent stroke work-up -Palliative care met with the patient and patient's family and because he is refusing to eat and not very interactive family decided to accept goals of care and transition to a comfort approach.   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 embolic secondary large vessel disease in the setting of hypotension -MRI of the brain showed a acute early subacute infarcts of the right MCA distribution primary involving the right corona radiata and remote infarcts in the left occipital lobe and some cerebellar hemisphere -EEG noted cortical dysfunction  -2D echo was unremarkable, preserved EF, LDL was 150, hemoglobin A1c was 7.6  -Noted to have dysphagia, placed on dysphagia 1 diet by speech therapy  -Eliquis was resumed  -Due to dementia, multiple strokes and ongoing failure to thrive with cognitive decline he was seen by palliative care this admission, decision made by family for comfort measures only  -No plan for residential hospice  Hypertension -Comfort care  Diabetes Mellitus Type 2 -Now comfort care  AKI on CKD stage 2 Metabolic acidosis -Comfort care  Vascular Dementia with behavioral disturbances and lethargy and refusal of care -Only oriented to self.  With ongoing delirium, refusing  care and limited p.o. intake  Goals of Care: -Palliative involved and he is a DNR and now comfort measures  Code Status: DO NOT RESUSCITATE  Family Communication: No family at bedside today Disposition Plan: Residential hospice Status is: Inpatient  Consultants:  Neurology Palliative care medicine  Procedures:  ECHOCARDIOGRAM IMPRESSIONS   1. Left ventricular ejection fraction, by estimation, is 60 to 65%. The  left ventricle has normal function. The left ventricle demonstrates  regional wall motion abnormalities (see scoring diagram/findings for  description). There is moderate left  ventricular hypertrophy. Left ventricular diastolic function could not be  evaluated. There is akinesis of the left ventricular, basal-mid  inferolateral wall.   2. Right ventricular systolic function is mildly reduced. The right  ventricular size is normal.  Antimicrobials:  Anti-infectives (From admission, onward)    None        Subjective: -No events overnight  Objective: Vitals:   11/12/20 0700 11/12/20 1245 11/12/20 2343 11/13/20 1029  BP: (!) 188/88 (!) 156/81 (!) 169/83 130/83  Pulse: 69 68 79 72  Resp: _0 Temp: 97.8 F (36.6 C) 98.5 F (36.9 C) 98.5 F (36.9 C) 97.8 F (36.6 C)  TempSrc: Axillary Oral Oral Oral  SpO2: 100% 98% 99% 100%  Weight:        Intake/Output Summary (Last 24 hours) at 11/13/2020 1323 Last data filed at 11/13/2020 0001 Gross per 24 hour  Intake 100 ml  Output --  Net 100 ml   Filed Weights   11/07/20 2228  Weight: 81.6 kg   Examination: Physical Exam:  Constitutional: Chronically ill elderly male sitting up in  bed, awake alert, oriented to self only CVS: S1-S2, regular rate rhythm Lungs: Decreased breath sounds to bases Abdomen: Soft, nontender, bowel sounds present Extremities: No edema Skin: No rash on exposed skin Psych: Poor insight and judgment   Data Reviewed: I have personally reviewed following labs and imaging  studies  CBC: Recent Labs  Lab 11/07/20 2221 11/07/20 2226 11/09/20 0930 11/10/20 0903  WBC 7.7  --  6.4 5.5  NEUTROABS 5.8  --  4.2 3.6  HGB 13.2 14.3 12.5* 11.2*  HCT 43.6 42.0 41.1 36.1*  MCV 97.3  --  95.1 94.8  PLT 253  --  224 063   Basic Metabolic Panel: Recent Labs  Lab 11/07/20 2221 11/07/20 2226 11/08/20 1010 11/09/20 0201 11/09/20 0930 11/10/20 0903  NA 139 141 140 142  --  141  K 5.5* 5.3* 4.8 4.7  --  4.7  CL 104 106 108 109  --  113*  CO2 23  --  22 24  --  20*  GLUCOSE 150* 150* 102* 86  --  118*  BUN 39* 40* 38* 34*  --  25*  CREATININE 3.33* 3.60* 3.03* 2.23*  --  1.38*  CALCIUM 9.0  --  8.4* 8.7*  --  8.4*  MG  --   --   --   --  2.4 2.1  PHOS  --   --   --   --  3.3 2.6   GFR: Estimated Creatinine Clearance: 46.3 mL/min (A) (by C-G formula based on SCr of 1.38 mg/dL (H)). Liver Function Tests: Recent Labs  Lab 11/07/20 2221 11/08/20 1010 11/09/20 0201 11/10/20 0903  AST _0 ALT _1 ALKPHOS 102 87 93 81  BILITOT 0.5 0.6 0.7 0.9  PROT 7.1 6.1* 6.5 6.1*  ALBUMIN 3.4* 2.9* 3.1* 2.8*   No results for input(s): LIPASE, AMYLASE in the last 168 hours. Recent Labs  Lab 11/09/20 0930  AMMONIA 19   Coagulation Profile: Recent Labs  Lab 11/07/20 2221  INR 1.7*   Cardiac Enzymes: No results for input(s): CKTOTAL, CKMB, CKMBINDEX, TROPONINI in the last 168 hours. BNP (last 3 results) No results for input(s): PROBNP in the last 8760 hours. HbA1C: No results for input(s): HGBA1C in the last 72 hours.  CBG: Recent Labs  Lab 11/09/20 1551 11/09/20 2107 11/10/20 0549 11/10/20 1109 11/10/20 1634  GLUCAP 97 108* 76 112* 121*   Lipid Profile: No results for input(s): CHOL, HDL, LDLCALC, TRIG, CHOLHDL, LDLDIRECT in the last 72 hours.  Thyroid Function Tests: No results for input(s): TSH, T4TOTAL, FREET4, T3FREE, THYROIDAB in the last 72 hours.  Anemia Panel: No results for input(s): VITAMINB12, FOLATE, FERRITIN,  TIBC, IRON, RETICCTPCT in the last 72 hours.  Sepsis Labs: No results for input(s): PROCALCITON, LATICACIDVEN in the last 168 hours.  Recent Results (from the past 240 hour(s))  Resp Panel by RT-PCR (Flu A&B, Covid) Nasopharyngeal Swab     Status: None   Collection Time: 11/08/20  2:06 AM   Specimen: Nasopharyngeal Swab; Nasopharyngeal(NP) swabs in vial transport medium  Result Value Ref Range Status   SARS Coronavirus 2 by RT PCR NEGATIVE NEGATIVE Final    Comment: (NOTE) SARS-CoV-2 target nucleic acids are NOT DETECTED.  The SARS-CoV-2 RNA is generally detectable in upper respiratory specimens during the acute phase of infection. The lowest concentration of SARS-CoV-2 viral copies this assay can detect is 138 copies/mL. A negative result does not preclude SARS-Cov-2 infection and should  not be used as the sole basis for treatment or other patient management decisions. A negative result may occur with  improper specimen collection/handling, submission of specimen other than nasopharyngeal swab, presence of viral mutation(s) within the areas targeted by this assay, and inadequate number of viral copies(<138 copies/mL). A negative result must be combined with clinical observations, patient history, and epidemiological information. The expected result is Negative.  Fact Sheet for Patients:  EntrepreneurPulse.com.au  Fact Sheet for Healthcare Providers:  IncredibleEmployment.be  This test is no t yet approved or cleared by the [place] FDA and  has been authorized for detection and/or diagnosis of SARS-CoV-2 by FDA under an Emergency Use Authorization (EUA). This EUA will remain  in effect (meaning this test can be used) for the duration of the COVID-19 declaration under Section 564(b)(1) of the Act, 21 U.S.C.section 360bbb-3(b)(1), unless the authorization is terminated  or revoked sooner.       Influenza A by PCR NEGATIVE NEGATIVE  Final   Influenza B by PCR NEGATIVE NEGATIVE Final    Comment: (NOTE) The Xpert Xpress SARS-CoV-2/FLU/RSV plus assay is intended as an aid in the diagnosis of influenza from Nasopharyngeal swab specimens and should not be used as a sole basis for treatment. Nasal washings and aspirates are unacceptable for Xpert Xpress SARS-CoV-2/FLU/RSV testing.  Fact Sheet for Patients: EntrepreneurPulse.com.au  Fact Sheet for Healthcare Providers: IncredibleEmployment.be  This test is not yet approved or cleared by the [place] FDA and has been authorized for detection and/or diagnosis of SARS-CoV-2 by FDA under an Emergency Use Authorization (EUA). This EUA will remain in effect (meaning this test can be used) for the duration of the COVID-19 declaration under Section 564(b)(1) of the Act, 21 U.S.C. section 360bbb-3(b)(1), unless the authorization is terminated or revoked.  Performed at [facility], [place] [address] [place], [place] [address]     RN Pressure Injury Documentation:     Estimated body mass index is 25.81 kg/m as calculated from the following:   Height as of 10/26/20: _0  (1.778 m).   Weight as of this encounter: 81.6 kg.  Malnutrition Type:   Malnutrition Characteristics:   Nutrition Interventions:   Radiology Studies: No results found.  Scheduled Meds:  haloperidol  0.5 mg Oral q morning   morphine CONCENTRATE  5 mg Oral Q6H   sodium chloride flush  3 mL Intravenous Once   Continuous Infusions:   LOS: 5 days   Seanmichael Salmons,MD [facility]

## 2020-11-14 MED ORDER — MORPHINE SULFATE 10 MG/5ML PO SOLN: 5.0000 mg | ORAL | Status: DC | PRN

## 2020-11-14 MED ORDER — MORPHINE SULFATE 10 MG/5ML PO SOLN
5.0000 mg | Freq: Four times a day (QID) | ORAL | Status: DC
  Administered 2020-11-15 (×3): 5 mg via ORAL
  Filled 2020-11-14 (×4): qty 4

## 2020-11-14 NOTE — Plan of Care (Signed)
COMFORT CARE

## 2020-11-14 NOTE — Progress Notes (Signed)
Patient seen and examined, sleeping but arousable this morning  -Awaiting residential hospice  Zannie Cove, MD

## 2020-11-14 NOTE — Progress Notes (Signed)
Civil engineer, contracting [facility])  Hospice hospital Liaison RN Note   Patient has been approved for [facility].    Unfortunately [facility] is not able to offer a room today. Family and TOC are aware [facility] Liaison will follow up with [facility] and family tomorrow or sooner if room becomes available.   Please do not hesitate to call with questions  Yolande Jolly, BSN, [facility] [phone]

## 2020-11-15 DIAGNOSIS — I63511 Cerebral infarction due to unspecified occlusion or stenosis of right middle cerebral artery: Secondary | ICD-10-CM | POA: Diagnosis not present

## 2020-11-15 DIAGNOSIS — N179 Acute kidney failure, unspecified: Secondary | ICD-10-CM | POA: Diagnosis not present

## 2020-11-15 MED ORDER — MORPHINE SULFATE 10 MG/5ML PO SOLN: 5.0000 mg | ORAL | 0 refills | Status: AC | PRN

## 2020-11-15 NOTE — TOC Transition Note (Signed)
Transition of Care [facility]) - CM/SW Discharge Note   Patient Details  Name: Berman Grainger MRN: [id number] Date of Birth: December 02, 1945  Transition of Care [facility]) CM/SW Contact:  Kermit Balo, RN Phone Number: 11/15/2020, 10:27 AM   Clinical Narrative:    Patient is discharging to [facility] today. [facility] will provided needed transport. Bedside RN updated and d/c packet at the desk.   Number for report: [phone]   Final next level of care: Hospice Medical Facility Barriers to Discharge: No Barriers Identified   Patient Goals and CMS Choice   CMS Medicare.gov Compare Post Acute Care list provided to:: Patient Represenative (must comment) Choice offered to / list presented to : Adult Children  Discharge Placement                       Discharge Plan and Services                                     Social Determinants of Health (SDOH) Interventions     Readmission Risk Interventions No flowsheet data found.

## 2020-11-15 NOTE — Discharge Summary (Signed)
Physician Discharge Summary  Randall [name] JKK:[id number] DOB: [birth year] DOA: 11/07/2020  PCP: Edmonia Caprio, NP  Admit date: 11/07/2020 Discharge date: 11/15/2020  Time spent: 35 minutes  Recommendations for Outpatient Follow-up:  Residential hospice for comfort focused care   Discharge Diagnoses:  Principal Problem:   CVA (cerebral vascular accident) [facility]) Vascular dementia Delirium Adult failure to thrive Moderate protein calorie malnutrition   AKI (acute kidney injury) ([place])   Hyperkalemia   Type 2 diabetes mellitus ([place])   Vascular dementia ([place])   Discharge Condition: Guarded  Diet recommendation: Comfort feeds  Filed Weights   11/07/20 2228  Weight: 81.6 kg    History of present illness:  75/male with history of vascular dementia, history of CVA, paroxysmal A. fib on anticoagulation, type 2 diabetes mellitus, hypertension was brought to the ED with slurred speech and left-sided weakness from his facility  He was evaluated several times this month for recurrent falls with negative work-up including CT head.  He presented as a code stroke and had findings of a new hypodensity in the right posterior limb of the internal capsule and right corona radiata on CT head.    Hospital Course:   Acute CVA with right MCA territory infarcts infarct embolic secondary large vessel disease in the setting of hypotension -MRI of the brain showed a acute early subacute infarcts of the right MCA distribution primary involving the right corona radiata and remote infarcts in the left occipital lobe and some cerebellar hemisphere -EEG noted cortical dysfunction  -2D echo was unremarkable, preserved EF, LDL was 150, hemoglobin A1c was 7.6  -Noted to have dysphagia, placed on dysphagia 1 diet by speech therapy  -Eliquis was resumed  -Due to dementia, multiple strokes and ongoing failure to thrive with cognitive decline he was seen by palliative care this admission, decision made by  family for comfort measures only  -Now plan for residential hospice   Hypertension -Comfort care   Diabetes Mellitus Type 2 -Now comfort care   AKI on CKD stage 2 Metabolic acidosis -Comfort care   Vascular Dementia with behavioral disturbances and lethargy and refusal of care -Only oriented to self.  With ongoing delirium, refusing care and limited p.o. intake   Goals of Care: -Palliative involved and he is a DNR and now comfort measures   Code Status: DO NOT RESUSCITATE    Consultants:  Neurology Palliative care medicine   Procedures:  ECHOCARDIOGRAM IMPRESSIONS   1. Left ventricular ejection fraction, by estimation, is 60 to 65%. The  left ventricle has normal function. The left ventricle demonstrates  regional wall motion abnormalities (see scoring diagram/findings for  description). There is moderate left  ventricular hypertrophy. Left ventricular diastolic function could not be  evaluated. There is akinesis of the left ventricular, basal-mid  inferolateral wall.   2. Right ventricular systolic function is mildly reduced. The right  ventricular size is normal.   Discharge Exam: Vitals:   11/15/20 0427 11/15/20 0832  BP: 138/66 (!) 155/80  Pulse: 88 91  Resp: 18 18  Temp: 97.8 F (36.6 C) 97.8 F (36.6 C)  SpO2: 98% 100%     Chronically ill elderly male laying up in bed, awake alert, oriented to self only CVS: S1-S2, regular rate rhythm Lungs: Decreased breath sounds to bases Abdomen: Soft, nontender, bowel sounds present Extremities: No edema Skin: No rash on exposed skin Psych: Poor insight and judgment   Discharge Instructions    Allergies as of 11/15/2020   No Known Allergies  Medication List     STOP taking these medications    Alka-Seltzer Original 325 MG Tbef tablet Generic drug: aspirin-sod bicarb-citric acid   apixaban 5 MG Tabs tablet Commonly known as: Eliquis   atorvastatin 40 MG tablet Commonly known as: LIPITOR    blood glucose meter kit and supplies   carvedilol 6.25 MG tablet Commonly known as: COREG   dapagliflozin propanediol 10 MG Tabs tablet Commonly known as: FARXIGA   dicyclomine 10 MG capsule Commonly known as: BENTYL   divalproex 125 MG DR tablet Commonly known as: DEPAKOTE   docusate sodium 100 MG capsule Commonly known as: COLACE   feeding supplement Liqd   glipiZIDE 5 MG tablet Commonly known as: GLUCOTROL   haloperidol 0.5 MG tablet Commonly known as: HALDOL   hydrALAZINE 50 MG tablet Commonly known as: APRESOLINE   insulin glargine (1 Unit Dial) 300 UNIT/ML Solostar Pen Commonly known as: TOUJEO   insulin glargine 100 UNIT/ML injection Commonly known as: LANTUS   INSULIN SYRINGE .5CC/30GX1/2" 30G X 1/2" 0.5 ML Misc   lipase/protease/amylase 12000-38000 units Cpep capsule Commonly known as: CREON   meclizine 25 MG tablet Commonly known as: ANTIVERT   mirtazapine 15 MG tablet Commonly known as: REMERON   ondansetron 4 MG tablet Commonly known as: ZOFRAN   pantoprazole 40 MG tablet Commonly known as: PROTONIX   polyethylene glycol powder 17 GM/SCOOP powder Commonly known as: GLYCOLAX/MIRALAX   sertraline 50 MG tablet Commonly known as: ZOLOFT   sulfamethoxazole-trimethoprim 800-160 MG tablet Commonly known as: BACTRIM DS   valsartan 320 MG tablet Commonly known as: DIOVAN   vitamin B-12 1000 MCG tablet Commonly known as: CYANOCOBALAMIN   zolpidem 5 MG tablet Commonly known as: AMBIEN       TAKE these medications    acetaminophen 325 MG tablet Commonly known as: TYLENOL Take 650 mg by mouth every 6 (six) hours as needed (pain).   morphine 10 MG/5ML solution Take 2.5 mLs (5 mg total) by mouth every 4 (four) hours as needed for severe pain or moderate pain.       No Known Allergies  Follow-up Information     [facility]. Schedule an appointment as soon as possible for a visit in 1 month(s).   Specialty:  Neurology Why: stroke clinic Contact information: [address] [place] [phone]                 The results of significant diagnostics from this hospitalization (including imaging, microbiology, ancillary and laboratory) are listed below for reference.    Significant Diagnostic Studies: CT HEAD WO CONTRAST (5MM)  Result Date: 10/26/2020 CLINICAL DATA:  Head and neck trauma EXAM: CT HEAD WITHOUT CONTRAST TECHNIQUE: Contiguous axial images were obtained from the base of the skull through the vertex without intravenous contrast. COMPARISON:  None. FINDINGS: Brain: There is no mass, hemorrhage or extra-axial collection. There is generalized atrophy without lobar predilection. Hypodensity of the white matter is most commonly associated with chronic microvascular disease. 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 Vascular: No abnormal hyperdensity of the major intracranial arteries or dural venous sinuses. No intracranial atherosclerosis. Skull: The visualized skull base, calvarium and extracranial soft tissues are normal. Sinuses/Orbits: No fluid levels or advanced mucosal thickening of the visualized paranasal sinuses. No mastoid or middle ear effusion. The orbits are normal. IMPRESSION: 1. No acute intracranial abnormality. 2. Unchanged appearance of old left PCA territory, right ACA territory  and left cerebellar infarcts. 3. Generalized atrophy and chronic ischemic microangiopathy Electronically Signed   By: Ulyses Jarred M.D.   On: 10/26/2020 23:26   CT Head Wo Contrast  Result Date: 10/25/2020 CLINICAL DATA:  [place], anticoagulated EXAM: CT HEAD WITHOUT CONTRAST TECHNIQUE: Contiguous axial images were obtained from the base of the skull through the vertex without intravenous contrast. COMPARISON:  09/17/2020, 09/18/2020 FINDINGS: Brain: No acute infarct or hemorrhage. Chronic encephalomalacia  within the left occipital lobe, as well as chronic small-vessel ischemic changes throughout the periventricular and subcortical white matter, stable since prior study. 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 Lateral ventricles and midline structures are unremarkable. No acute extra-axial fluid collection. No mass effect. Vascular: No hyperdense vessel or unexpected calcification. Skull: Normal. Negative for fracture or focal lesion. Sinuses/Orbits: No acute finding. Other: None. IMPRESSION: 1. No significant change in the subacute left cerebellar infarct, chronic left occipital infarct, and chronic white matter ischemic changes seen previously. 2. No acute infarct or hemorrhage. Electronically Signed   By: Randa Ngo M.D.   On: 10/25/2020 17:25   CT Cervical Spine Wo Contrast  Result Date: 10/26/2020 CLINICAL DATA:  Trauma EXAM: CT CERVICAL SPINE WITHOUT CONTRAST TECHNIQUE: Multidetector CT imaging of the cervical spine was performed without intravenous contrast. Multiplanar CT image reconstructions were also generated. COMPARISON:  None. FINDINGS: Alignment: No static subluxation. Facets are aligned. Occipital condyles and the lateral masses of C1 and C2 are normally approximated. Skull base and vertebrae: No acute fracture. Soft tissues and spinal canal: No prevertebral fluid or swelling. No visible canal hematoma. Disc levels: No advanced spinal canal or neural foraminal stenosis. Upper chest: No pneumothorax, pulmonary nodule or pleural effusion. Other: Normal visualized paraspinal cervical soft tissues. IMPRESSION: No acute fracture or static subluxation of the cervical spine. Electronically Signed   By: Ulyses Jarred M.D.   On: 10/26/2020 23:37   CT Cervical Spine Wo Contrast  Result Date: 10/25/2020 CLINICAL DATA:  [place], anticoagulated EXAM: CT CERVICAL SPINE WITHOUT CONTRAST TECHNIQUE: Multidetector CT  imaging of the cervical spine was performed without intravenous contrast. Multiplanar CT image reconstructions were also generated. COMPARISON:  09/17/2020 FINDINGS: Alignment: Alignment is anatomic. Skull base and vertebrae: No acute fracture. No primary bone lesion or focal pathologic process. Soft tissues and spinal canal: No prevertebral fluid or swelling. No visible canal hematoma. Disc levels: Multilevel spondylosis and facet hypertrophy most pronounced at the C5-6 level, stable. Upper chest: Airway is patent.  Lung apices are clear. Other: Reconstructed images demonstrate no additional findings. IMPRESSION: 1. No acute cervical spine fracture. 2. Stable lower cervical spondylosis. Electronically Signed   By: Randa Ngo M.D.   On: 10/25/2020 17:27   MR ANGIO HEAD WO CONTRAST  Result Date: 11/08/2020 CLINICAL DATA:  History of vascular dementia, CVA, AFib presenting with slurred speech and left arm weakness EXAM: MRI HEAD WITHOUT CONTRAST MRA HEAD WITHOUT CONTRAST MRA NECK WITHOUT CONTRAST TECHNIQUE: Multiplanar, multiecho pulse sequences of the brain and surrounding structures were obtained without intravenous contrast. Angiographic images of the Circle of Willis were obtained using MRA technique without intravenous contrast. Angiographic images of the neck were obtained using MRA technique without intravenous contrast. Carotid stenosis measurements (when applicable) are obtained utilizing NASCET criteria, using the distal internal carotid diameter as the denominator. COMPARISON:  Noncontrast CT head obtained 1 day prior, brain MRI 09/18/2020 FINDINGS: MRI HEAD FINDINGS Brain: There is patchy diffusion restriction in the right  MCA distribution primarily in the corona radiata but also involving the peritrigonal white matter and internal capsule. There is no associated hemorrhage. There is patchy associated FLAIR signal abnormality. There is more ill-defined diffusion restriction in the left occipital  lobe, though this is in an area of encephalomalacia and is favored to reflect T2 shine through from a prior infarct. There is a background of extensive FLAIR signal abnormality throughout the subcortical and periventricular white matter consistent with advanced chronic white matter microangiopathy. As above, there is a remote left occipital lobe infarct. Additional prior infarcts are seen in the left cerebellar hemisphere. The ventricles are stable in size. There is no solid mass lesion. There is no midline shift. Vascular: The major flow voids are present. The vasculature is assessed in full below. Skull and upper cervical spine: Normal marrow signal. Sinuses/Orbits: The paranasal sinuses are clear. The globes and orbits are unremarkable. Other: None. MRA HEAD FINDINGS Anterior circulation: The intracranial ICAs are patent. There is focal critical stenosis of the right M1 segment with a possible trickle of flow related enhancement traversing the stenosis (see key image). This appears similar to the prior CTA head from 09/17/2020 allowing for difference in modality. There is reconstitution of flow in the distal right MCA branches, though this appears diminished compared to the contralateral side. The left MCA is patent. There is focal high-grade stenosis of the left A2 segment, similar to the prior CTA (3-152). The ACAs are otherwise patent. Posterior circulation: The bilateral V4 segments are patent. The basilar artery is patent. There is diminished enhancement of the left PCA, likely unchanged compared to the prior CTA head and consistent with the history of prior left PCA infarct. The right PCA is patent and predominantly supplied from a prominent right posterior communicating artery. There is a fetal origin of the left PCA. There is no aneurysm. Anatomic variants: As above. MRA NECK FINDINGS Aortic arch: The imaged portions of the aortic arch are unremarkable Right carotid system: The right common, internal, and  external carotid arteries demonstrate normal flow related enhancement without evidence of high-grade stenosis, occlusion, dissection, or aneurysm. Left carotid system: The left common, internal, and external carotid arteries demonstrate normal flow related enhancement without evidence of high-grade stenosis, occlusion, dissection, or aneurysm. Vertebral arteries: The vertebral arteries are patent with antegrade flow. There is no evidence of high-grade stenosis, occlusion, dissection, or aneurysm. IMPRESSION: 1. Acute to early subacute infarcts throughout the right MCA distribution primarily involving the corona radiata. 2. Remote infarcts in the left occipital lobe and left cerebellar hemisphere. 3. Critical stenosis of the right M1 segment with a trickle of flow lead enhancement traversing the stenosis, similar to the prior CTA head allowing for difference in modality. 4. Focal high-grade stenosis of the left A2 segment and multifocal stenosis with overall diminished enhancement in the left PCA are not significantly changed. 5. Patent vasculature of the neck with no high-grade stenosis, occlusion, dissection, or aneurysm. Electronically Signed   By: Valetta Mole M.D.   On: 11/08/2020 09:53   MR ANGIO NECK WO CONTRAST  Result Date: 11/08/2020 CLINICAL DATA:  History of vascular dementia, CVA, AFib presenting with slurred speech and left arm weakness EXAM: MRI HEAD WITHOUT CONTRAST MRA HEAD WITHOUT CONTRAST MRA NECK WITHOUT CONTRAST TECHNIQUE: Multiplanar, multiecho pulse sequences of the brain and surrounding structures were obtained without intravenous contrast. Angiographic images of the Circle of Willis were obtained using MRA technique without intravenous contrast. Angiographic images of the neck were obtained using MRA  technique without intravenous contrast. Carotid stenosis measurements (when applicable) are obtained utilizing NASCET criteria, using the distal internal carotid diameter as the denominator.  COMPARISON:  Noncontrast CT head obtained 1 day prior, brain MRI 09/18/2020 FINDINGS: MRI HEAD FINDINGS Brain: There is patchy diffusion restriction in the right MCA distribution primarily in the corona radiata but also involving the peritrigonal white matter and internal capsule. There is no associated hemorrhage. There is patchy associated FLAIR signal abnormality. There is more ill-defined diffusion restriction in the left occipital lobe, though this is in an area of encephalomalacia and is favored to reflect T2 shine through from a prior infarct. There is a background of extensive FLAIR signal abnormality throughout the subcortical and periventricular white matter consistent with advanced chronic white matter microangiopathy. As above, there is a remote left occipital lobe infarct. Additional prior infarcts are seen in the left cerebellar hemisphere. The ventricles are stable in size. There is no solid mass lesion. There is no midline shift. Vascular: The major flow voids are present. The vasculature is assessed in full below. Skull and upper cervical spine: Normal marrow signal. Sinuses/Orbits: The paranasal sinuses are clear. The globes and orbits are unremarkable. Other: None. MRA HEAD FINDINGS Anterior circulation: The intracranial ICAs are patent. There is focal critical stenosis of the right M1 segment with a possible trickle of flow related enhancement traversing the stenosis (see key image). This appears similar to the prior CTA head from 09/17/2020 allowing for difference in modality. There is reconstitution of flow in the distal right MCA branches, though this appears diminished compared to the contralateral side. The left MCA is patent. There is focal high-grade stenosis of the left A2 segment, similar to the prior CTA (3-152). The ACAs are otherwise patent. Posterior circulation: The bilateral V4 segments are patent. The basilar artery is patent. There is diminished enhancement of the left PCA,  likely unchanged compared to the prior CTA head and consistent with the history of prior left PCA infarct. The right PCA is patent and predominantly supplied from a prominent right posterior communicating artery. There is a fetal origin of the left PCA. There is no aneurysm. Anatomic variants: As above. MRA NECK FINDINGS Aortic arch: The imaged portions of the aortic arch are unremarkable Right carotid system: The right common, internal, and external carotid arteries demonstrate normal flow related enhancement without evidence of high-grade stenosis, occlusion, dissection, or aneurysm. Left carotid system: The left common, internal, and external carotid arteries demonstrate normal flow related enhancement without evidence of high-grade stenosis, occlusion, dissection, or aneurysm. Vertebral arteries: The vertebral arteries are patent with antegrade flow. There is no evidence of high-grade stenosis, occlusion, dissection, or aneurysm. IMPRESSION: 1. Acute to early subacute infarcts throughout the right MCA distribution primarily involving the corona radiata. 2. Remote infarcts in the left occipital lobe and left cerebellar hemisphere. 3. Critical stenosis of the right M1 segment with a trickle of flow lead enhancement traversing the stenosis, similar to the prior CTA head allowing for difference in modality. 4. Focal high-grade stenosis of the left A2 segment and multifocal stenosis with overall diminished enhancement in the left PCA are not significantly changed. 5. Patent vasculature of the neck with no high-grade stenosis, occlusion, dissection, or aneurysm. Electronically Signed   By: Valetta Mole M.D.   On: 11/08/2020 09:53   MR Brain Wo Contrast (neuro protocol)  Result Date: 11/08/2020 CLINICAL DATA:  History of vascular dementia, CVA, AFib presenting with slurred speech and left arm weakness EXAM: MRI HEAD WITHOUT  CONTRAST MRA HEAD WITHOUT CONTRAST MRA NECK WITHOUT CONTRAST TECHNIQUE: Multiplanar,  multiecho pulse sequences of the brain and surrounding structures were obtained without intravenous contrast. Angiographic images of the Circle of Willis were obtained using MRA technique without intravenous contrast. Angiographic images of the neck were obtained using MRA technique without intravenous contrast. Carotid stenosis measurements (when applicable) are obtained utilizing NASCET criteria, using the distal internal carotid diameter as the denominator. COMPARISON:  Noncontrast CT head obtained 1 day prior, brain MRI 09/18/2020 FINDINGS: MRI HEAD FINDINGS Brain: There is patchy diffusion restriction in the right MCA distribution primarily in the corona radiata but also involving the peritrigonal white matter and internal capsule. There is no associated hemorrhage. There is patchy associated FLAIR signal abnormality. There is more ill-defined diffusion restriction in the left occipital lobe, though this is in an area of encephalomalacia and is favored to reflect T2 shine through from a prior infarct. There is a background of extensive FLAIR signal abnormality throughout the subcortical and periventricular white matter consistent with advanced chronic white matter microangiopathy. As above, there is a remote left occipital lobe infarct. Additional prior infarcts are seen in the left cerebellar hemisphere. The ventricles are stable in size. There is no solid mass lesion. There is no midline shift. Vascular: The major flow voids are present. The vasculature is assessed in full below. Skull and upper cervical spine: Normal marrow signal. Sinuses/Orbits: The paranasal sinuses are clear. The globes and orbits are unremarkable. Other: None. MRA HEAD FINDINGS Anterior circulation: The intracranial ICAs are patent. There is focal critical stenosis of the right M1 segment with a possible trickle of flow related enhancement traversing the stenosis (see key image). This appears similar to the prior CTA head from  09/17/2020 allowing for difference in modality. There is reconstitution of flow in the distal right MCA branches, though this appears diminished compared to the contralateral side. The left MCA is patent. There is focal high-grade stenosis of the left A2 segment, similar to the prior CTA (3-152). The ACAs are otherwise patent. Posterior circulation: The bilateral V4 segments are patent. The basilar artery is patent. There is diminished enhancement of the left PCA, likely unchanged compared to the prior CTA head and consistent with the history of prior left PCA infarct. The right PCA is patent and predominantly supplied from a prominent right posterior communicating artery. There is a fetal origin of the left PCA. There is no aneurysm. Anatomic variants: As above. MRA NECK FINDINGS Aortic arch: The imaged portions of the aortic arch are unremarkable Right carotid system: The right common, internal, and external carotid arteries demonstrate normal flow related enhancement without evidence of high-grade stenosis, occlusion, dissection, or aneurysm. Left carotid system: The left common, internal, and external carotid arteries demonstrate normal flow related enhancement without evidence of high-grade stenosis, occlusion, dissection, or aneurysm. Vertebral arteries: The vertebral arteries are patent with antegrade flow. There is no evidence of high-grade stenosis, occlusion, dissection, or aneurysm. IMPRESSION: 1. Acute to early subacute infarcts throughout the right MCA distribution primarily involving the corona radiata. 2. Remote infarcts in the left occipital lobe and left cerebellar hemisphere. 3. Critical stenosis of the right M1 segment with a trickle of flow lead enhancement traversing the stenosis, similar to the prior CTA head allowing for difference in modality. 4. Focal high-grade stenosis of the left A2 segment and multifocal stenosis with overall diminished enhancement in the left PCA are not significantly  changed. 5. Patent vasculature of the neck with no high-grade stenosis, occlusion,  dissection, or aneurysm. Electronically Signed   By: Valetta Mole M.D.   On: 11/08/2020 09:53   EEG adult  Result Date: 11/09/2020 Lora Havens, MD     11/09/2020  9:26 PM Patient Name: Randall [name] MRN: [id number] Epilepsy Attending: Lora Havens Referring Physician/Provider: Dr Carlisle Cater Date: 11/09/2020 Duration: 21.24 mins Patient history: 75yo M with dementia, acute Right MCA infarct with ams. EEG to evaluate for seizure. Level of alertness: Awake AEDs during EEG study: None Technical aspects: This EEG study was done with scalp electrodes positioned according to the 10-20 International system of electrode placement. Electrical activity was acquired at a sampling rate of '500Hz'  and reviewed with a high frequency filter of '70Hz'  and a low frequency filter of '1Hz' . EEG data were recorded continuously and digitally stored. Description: The posterior dominant rhythm consists of 8 Hz activity of moderate voltage (25-35 uV) seen predominantly in posterior head regions, symmetric and reactive to eye opening and eye closing. EEG showed continuous 3 to 6 Hz theta-delta slowing in right temporal region.  Hyperventilation and photic stimulation were not performed.   Of note, this study was technically difficult due to significant movement artifact. ABNORMALITY - Continuous slow, right temporal region. IMPRESSION: This technically difficult study is suggestive of cortical dysfunction arising from right temporal region. likely secondary to underlying structural abnormality/stroke. No seizures or epileptiform discharges were seen throughout the recording. Lora Havens   CT HEAD CODE STROKE WO CONTRAST  Result Date: 11/07/2020 CLINICAL DATA:  Code stroke. EXAM: CT HEAD WITHOUT CONTRAST TECHNIQUE: Contiguous axial images were obtained from the base of the skull through the vertex without intravenous contrast. COMPARISON:   10/26/2020. FINDINGS: Brain: New hypodensities in the right posterior limb of the internal capsule (series 3, image 17) and right corona radiata (series 3, image 20), of indeterminate acuity. Redemonstrated hypodensity in the left PCA territory, right ACA territory, left medulla, and left cerebellum. Periventricular white matter changes, likely the sequela of chronic small vessel ischemic disease. Vascular: No hyperdense vessel. Skull: Normal. Negative for fracture or focal lesion. Sinuses/Orbits: No acute finding. Other: The mastoids are well aerated. ASPECTS [facility] Stroke Program Early CT Score) - Ganglionic level infarction (caudate, lentiform nuclei, internal capsule, insula, M1-M3 cortex): 6 - Supraganglionic infarction (M4-M6 cortex): 3 Total score (0-10 with 10 being normal): 9 IMPRESSION: 1. New hypodensities in the right posterior limb of the internal capsule and right corona radiata, likely acute or subacute infarcts. No hemorrhage. 2. ASPECTS is 9. Code stroke imaging results were communicated on 11/07/2020 at 10:39 pm to provider [facility] via secure text paging. Electronically Signed   By: Merilyn Baba M.D.   On: 11/07/2020 22:42   ECHOCARDIOGRAM LIMITED  Result Date: 11/08/2020    ECHOCARDIOGRAM LIMITED REPORT   Patient Name:   MOSIAH BASTIN Date of Exam: 11/08/2020 Medical Rec #:  [id number]   Height:       70.0 in Accession #:    [id number]  Weight:       179.9 lb Date of Birth:  [birth year]   BSA:          1.995 m Patient Age:    9 years    BP:           134/73 mmHg Patient Gender: M           HR:           56 bpm. Exam Location:  Inpatient Procedure: Limited Echo, Limited Color Doppler and Cardiac Doppler  Indications:    stroke  History:        Patient has prior history of Echocardiogram examinations, most                 recent 09/17/2020. COPD, Signs/Symptoms:Alzheimer's; Risk                 Factors:Hypertension and Dyslipidemia.  Sonographer:    Johny Chess RDCS Referring Phys: [id number]  [place]  1. Left ventricular ejection fraction, by estimation, is 60 to 65%. The left ventricle has normal function. The left ventricle demonstrates regional wall motion abnormalities (see scoring diagram/findings for description). There is moderate left ventricular hypertrophy. Left ventricular diastolic function could not be evaluated. There is akinesis of the left ventricular, basal-mid inferolateral wall.  2. Right ventricular systolic function is mildly reduced. The right ventricular size is normal.  3. The mitral valve is normal in structure. Trivial mitral valve regurgitation. No evidence of mitral stenosis.  4. The aortic valve is tricuspid. Aortic valve regurgitation is not visualized. No aortic stenosis is present. FINDINGS  Left Ventricle: Left ventricular ejection fraction, by estimation, is 60 to 65%. The left ventricle has normal function. The left ventricle demonstrates regional wall motion abnormalities. The left ventricular internal cavity size was normal in size. There is moderate left ventricular hypertrophy. Left ventricular diastolic function could not be evaluated. Right Ventricle: The right ventricular size is normal. Right ventricular systolic function is mildly reduced. Left Atrium: Left atrial size was normal in size. Right Atrium: Right atrial size was normal in size. Pericardium: There is no evidence of pericardial effusion. Mitral Valve: The mitral valve is normal in structure. Mild mitral annular calcification. Trivial mitral valve regurgitation. No evidence of mitral valve stenosis. Tricuspid Valve: The tricuspid valve is normal in structure. Tricuspid valve regurgitation is mild . No evidence of tricuspid stenosis. Aortic Valve: The aortic valve is tricuspid. Aortic valve regurgitation is not visualized. No aortic stenosis is present. Pulmonic Valve: The pulmonic valve was not assessed. Aorta: The aortic root is normal in size and structure. Venous: The inferior vena  cava was not well visualized. IAS/Shunts: No atrial level shunt detected by color flow Doppler. LEFT VENTRICLE PLAX 2D LVIDd:         4.80 cm LVIDs:         3.70 cm LV PW:         1.30 cm LV IVS:        1.80 cm LVOT diam:     2.10 cm LVOT Area:     3.46 cm  LEFT ATRIUM         Index LA diam:    4.40 cm 2.21 cm/m  MITRAL VALVE                TRICUSPID VALVE MV Area (PHT): 2.54 cm     TR Peak grad:   24.2 mmHg MV Decel Time: 299 msec     TR Vmax:        246.00 cm/s MV E velocity: 67.90 cm/s MV A velocity: 128.00 cm/s  SHUNTS MV E/A ratio:  0.53         Systemic Diam: 2.10 cm Kirk Ruths MD Electronically signed by Kirk Ruths MD Signature Date/Time: 11/08/2020/4:23:48 PM    Final     Microbiology: Recent Results (from the past 240 hour(s))  Resp Panel by RT-PCR (Flu A&B, Covid) Nasopharyngeal Swab     Status: None   Collection Time: 11/08/20  2:06 AM  Specimen: Nasopharyngeal Swab; Nasopharyngeal(NP) swabs in vial transport medium  Result Value Ref Range Status   SARS Coronavirus 2 by RT PCR NEGATIVE NEGATIVE Final    Comment: (NOTE) SARS-CoV-2 target nucleic acids are NOT DETECTED.  The SARS-CoV-2 RNA is generally detectable in upper respiratory specimens during the acute phase of infection. The lowest concentration of SARS-CoV-2 viral copies this assay can detect is 138 copies/mL. A negative result does not preclude SARS-Cov-2 infection and should not be used as the sole basis for treatment or other patient management decisions. A negative result may occur with  improper specimen collection/handling, submission of specimen other than nasopharyngeal swab, presence of viral mutation(s) within the areas targeted by this assay, and inadequate number of viral copies(<138 copies/mL). A negative result must be combined with clinical observations, patient history, and epidemiological information. The expected result is Negative.  Fact Sheet for Patients:   EntrepreneurPulse.com.au  Fact Sheet for Healthcare Providers:  IncredibleEmployment.be  This test is no t yet approved or cleared by the [place] FDA and  has been authorized for detection and/or diagnosis of SARS-CoV-2 by FDA under an Emergency Use Authorization (EUA). This EUA will remain  in effect (meaning this test can be used) for the duration of the COVID-19 declaration under Section 564(b)(1) of the Act, 21 U.S.C.section 360bbb-3(b)(1), unless the authorization is terminated  or revoked sooner.       Influenza A by PCR NEGATIVE NEGATIVE Final   Influenza B by PCR NEGATIVE NEGATIVE Final    Comment: (NOTE) The Xpert Xpress SARS-CoV-2/FLU/RSV plus assay is intended as an aid in the diagnosis of influenza from Nasopharyngeal swab specimens and should not be used as a sole basis for treatment. Nasal washings and aspirates are unacceptable for Xpert Xpress SARS-CoV-2/FLU/RSV testing.  Fact Sheet for Patients: EntrepreneurPulse.com.au  Fact Sheet for Healthcare Providers: IncredibleEmployment.be  This test is not yet approved or cleared by the [place] FDA and has been authorized for detection and/or diagnosis of SARS-CoV-2 by FDA under an Emergency Use Authorization (EUA). This EUA will remain in effect (meaning this test can be used) for the duration of the COVID-19 declaration under Section 564(b)(1) of the Act, 21 U.S.C. section 360bbb-3(b)(1), unless the authorization is terminated or revoked.  Performed at [facility], [place] [address], [place], [place] [address]      Labs: Basic Metabolic Panel: Recent Labs  Lab 11/09/20 0201 11/09/20 0930 11/10/20 0903  NA 142  --  141  K 4.7  --  4.7  CL 109  --  113*  CO2 24  --  20*  GLUCOSE 86  --  118*  BUN 34*  --  25*  CREATININE 2.23*  --  1.38*  CALCIUM 8.7*  --  8.4*  MG  --  2.4 2.1  PHOS  --  3.3 2.6   Liver  Function Tests: Recent Labs  Lab 11/09/20 0201 11/10/20 0903  AST 21 28  ALT 13 13  ALKPHOS 93 81  BILITOT 0.7 0.9  PROT 6.5 6.1*  ALBUMIN 3.1* 2.8*   No results for input(s): LIPASE, AMYLASE in the last 168 hours. Recent Labs  Lab 11/09/20 0930  AMMONIA 19   CBC: Recent Labs  Lab 11/09/20 0930 11/10/20 0903  WBC 6.4 5.5  NEUTROABS 4.2 3.6  HGB 12.5* 11.2*  HCT 41.1 36.1*  MCV 95.1 94.8  PLT 224 202   Cardiac Enzymes: No results for input(s): CKTOTAL, CKMB, CKMBINDEX, TROPONINI in the last 168 hours.  BNP: BNP (last 3 results) No results for input(s): BNP in the last 8760 hours.  ProBNP (last 3 results) No results for input(s): PROBNP in the last 8760 hours.  CBG: Recent Labs  Lab 11/09/20 1551 11/09/20 2107 11/10/20 0549 11/10/20 1109 11/10/20 1634  GLUCAP 97 108* 76 112* 121*       Signed:  Domenic Polite MD.  [facility] 11/15/2020, 10:12 AM

## 2020-11-15 NOTE — Progress Notes (Signed)
[facility] [id number]  [facility]) Hospital Liaison   Bed available today at [facility]. Family aware and agreeable to transfer today.  RN please call report to [phone].   [facility] liaison will confirm when consents have been signed and TOC can arrange transport.   Thank you.   Dionicio Stall, [facility] Liaison

## 2020-11-19 DEATH — deceased

## 2022-04-11 IMAGING — CT CT ANGIO HEAD-NECK (W OR W/O PERF)
2 of 7 series · 8 of 33 positions shown · IV contrast (OMNI 350)
Comparison: Correlation made with CT head earlier same day

CLINICAL DATA: Stroke, evaluate circulation

EXAM:
CT ANGIOGRAPHY HEAD AND NECK
TECHNIQUE: Multidetector CT imaging of the head and neck was performed using
the standard protocol during bolus administration of intravenous
contrast. Multiplanar CT image reconstructions and MIPs were
obtained to evaluate the vascular anatomy. Carotid stenosis
measurements (when applicable) are obtained utilizing NASCET
criteria, using the distal internal carotid diameter as the
denominator.
CONTRAST:  75mL OMNIPAQUE IOHEXOL 350 MG/ML SOLN

[Series 5: cta neck · axial · 0.58mm/px · z∈[-214,-92]mm · 2 of 184 slices shown]
[im 62/184  soft-tissue]
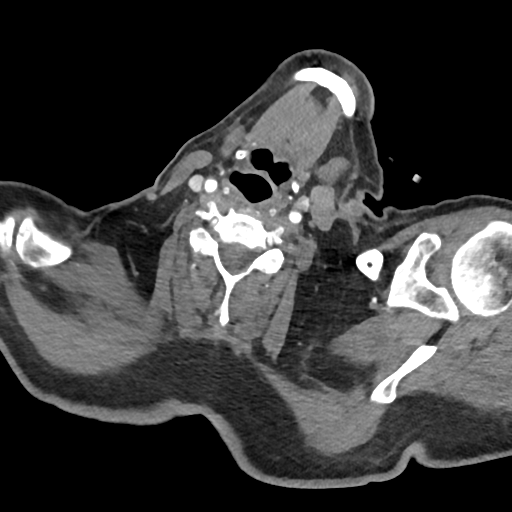
[im 123/184  soft-tissue]
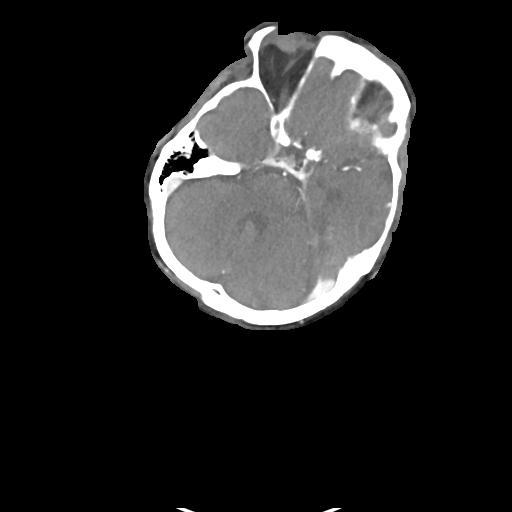

[Series 7: cta neck axial · axial · 0.47mm/px · z∈[-284,-22]mm · 6 of 368 slices shown]
[im 53/368  soft-tissue]
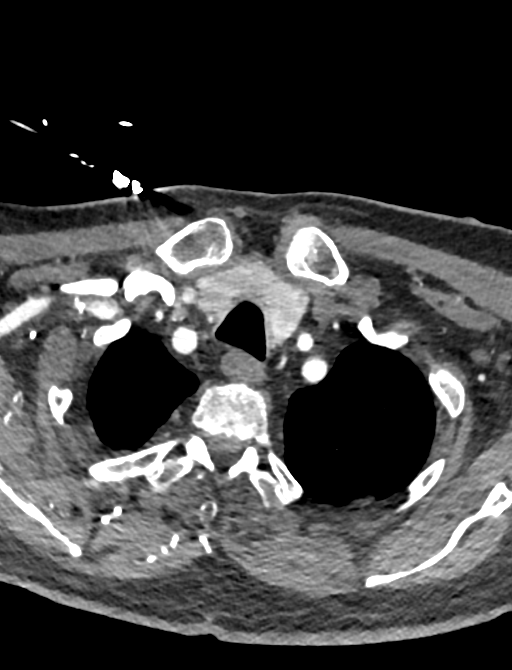
[im 105/368  bone]
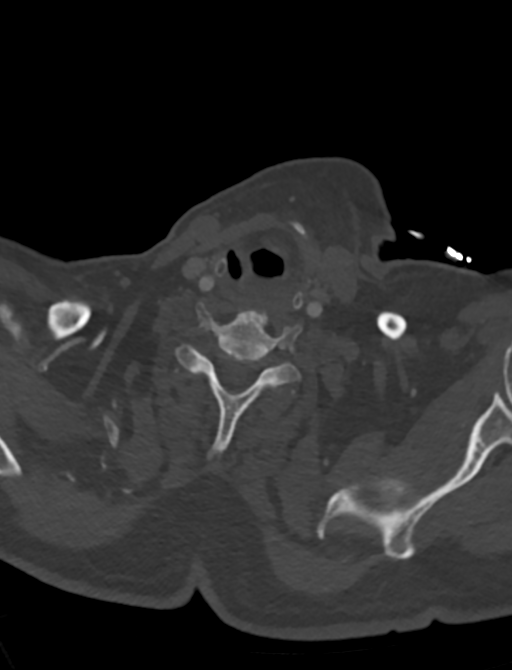
[im 158/368  soft-tissue]
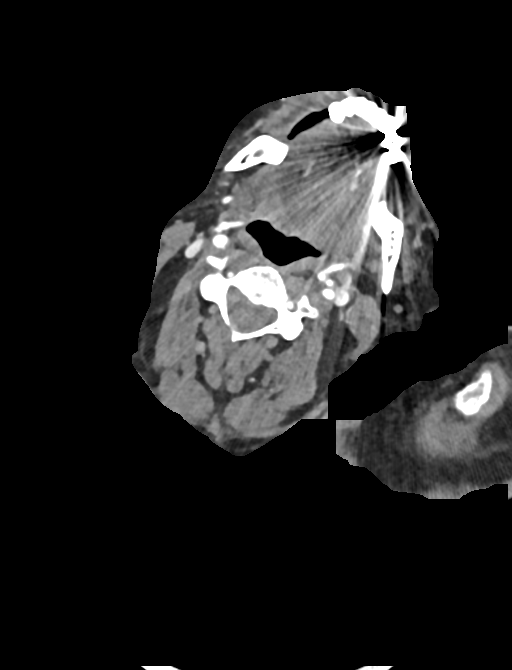
[im 210/368  bone]
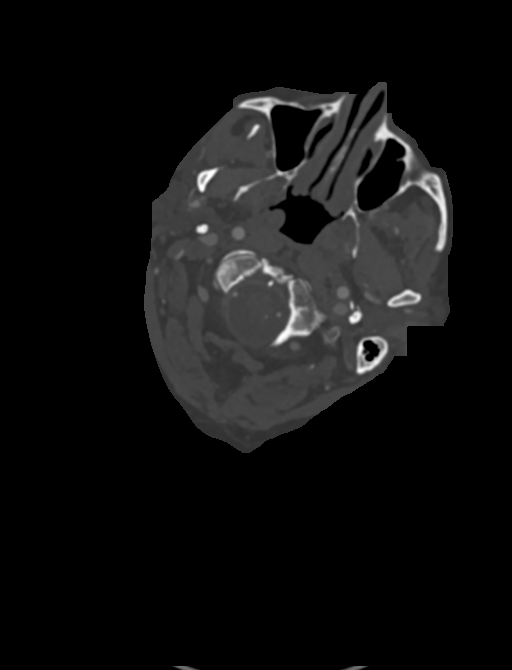
[im 263/368  soft-tissue]
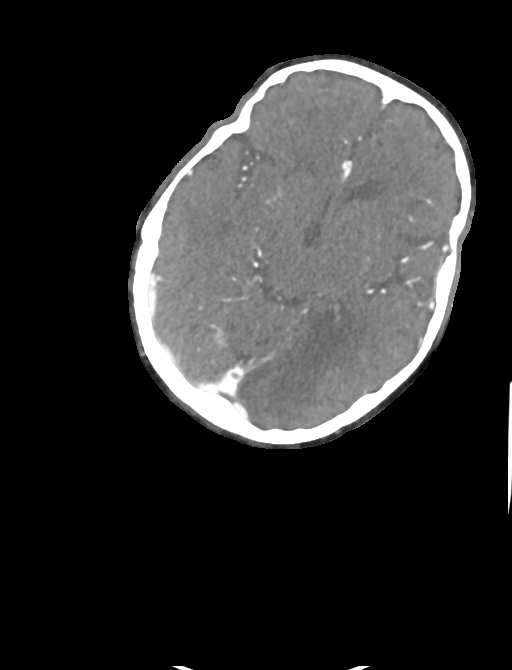
[im 315/368  bone]
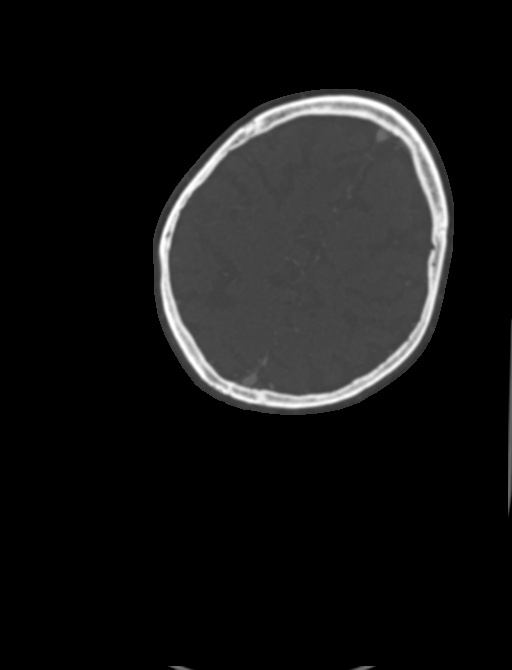

[8 of 33 positions shown; findings below may reference images not displayed]

FINDINGS: CTA NECK

Aortic arch: Mixed plaque along the arch and at the patent great
vessel origins. There is no high-grade proximal subclavian artery
stenosis.

Right carotid system: Patent. Mild noncalcified plaque along the
common carotid. Mixed plaque along the proximal internal carotid
with minimal stenosis.

Left carotid system: Patent. Minimal noncalcified plaque along the
common carotid. Mild calcified plaque along the proximal internal
carotid with minimal stenosis. Partially retropharyngeal course.

Vertebral arteries: Patent. Left vertebral artery is dominant. No
stenosis.

Skeleton: Cervical spine degenerative changes.

Other neck: Subcentimeter left thyroid nodule. No ultrasound follow
up is recommended by current guidelines.

Upper chest: No apical lung mass.

Review of the MIP images confirms the above findings

CTA HEAD

Anterior circulation: The intracranial internal carotid arteries are
patent with calcified plaque but no significant stenosis. Anterior
cerebral arteries are patent. Anterior communicating artery is
present. There is focal high-grade stenosis of the left A2 ACA.
Middle cerebral arteries are patent. There is high-grade stenosis of
the right M1 MCA. There are distal ACA and MCA stenoses bilaterally.

Posterior circulation: Intracranial vertebral arteries are patent.
Noncalcified plaque along the distal right vertebral causes moderate
to marked stenosis. Basilar artery is patent. Major cerebellar
artery origins are patent. Bilateral posterior communicating
arteries are present. There is fetal origin of the left PCA.
Posterior cerebral arteries are patent. There are high-grade
stenoses of the left P2 and P3 PCA.

Venous sinuses: Patent as allowed by contrast bolus timing.

Review of the MIP images confirms the above findings
IMPRESSION: No occlusion in the neck.  Mild plaque at the ICA origins.

Significant intracranial atherosclerosis. Moderate to marked
stenosis of the distal right vertebral artery. There is focal
high-grade stenosis of the left A2 ACA, right M1 MCA, and left P2
and P3 PCA. Additional distal ACA and MCA branch stenoses
bilaterally.
